# Patient Record
Sex: Male | Born: 1960 | Race: White | Hispanic: No | Marital: Married | Smoking: Current every day smoker
Health system: Southern US, Community
[De-identification: ages and names within clinical notes are randomized; demographics above are authoritative.]

## PROBLEM LIST (undated history)

## (undated) DIAGNOSIS — M545 Low back pain, unspecified: Secondary | ICD-10-CM

## (undated) DIAGNOSIS — N200 Calculus of kidney: Secondary | ICD-10-CM

## (undated) DIAGNOSIS — Z8719 Personal history of other diseases of the digestive system: Secondary | ICD-10-CM

## (undated) DIAGNOSIS — F411 Generalized anxiety disorder: Secondary | ICD-10-CM

## (undated) HISTORY — DX: Low back pain: M54.5

## (undated) HISTORY — DX: Calculus of kidney: N20.0

## (undated) HISTORY — DX: Low back pain, unspecified: M54.50

## (undated) HISTORY — DX: Personal history of other diseases of the digestive system: Z87.19

## (undated) HISTORY — DX: Generalized anxiety disorder: F41.1

---

## 2005-01-14 ENCOUNTER — Ambulatory Visit: Payer: Self-pay | Admitting: Cardiology

## 2005-01-14 ENCOUNTER — Ambulatory Visit: Payer: Self-pay | Admitting: Family Medicine

## 2005-01-19 ENCOUNTER — Ambulatory Visit: Payer: Self-pay | Admitting: Family Medicine

## 2005-02-01 ENCOUNTER — Ambulatory Visit: Payer: Self-pay | Admitting: Internal Medicine

## 2005-05-04 ENCOUNTER — Ambulatory Visit: Payer: Self-pay | Admitting: Internal Medicine

## 2006-03-27 ENCOUNTER — Ambulatory Visit: Payer: Self-pay | Admitting: Internal Medicine

## 2006-05-29 ENCOUNTER — Ambulatory Visit: Payer: Self-pay | Admitting: Internal Medicine

## 2006-05-29 LAB — CONVERTED CEMR LAB
ALT: 57 units/L — ABNORMAL HIGH (ref 0–40)
Albumin: 4 g/dL (ref 3.5–5.2)
Alkaline Phosphatase: 48 units/L (ref 39–117)
Basophils Absolute: 0 10*3/uL (ref 0.0–0.1)
Chol/HDL Ratio, serum: 3.4
Cholesterol: 227 mg/dL (ref 0–200)
Creatinine, Ser: 1.2 mg/dL (ref 0.4–1.5)
Eosinophil percent: 3 % (ref 0.0–5.0)
Glomerular Filtration Rate, Af Am: 85 mL/min/{1.73_m2}
Glucose, Bld: 99 mg/dL (ref 70–99)
HCT: 45.8 % (ref 39.0–52.0)
Hemoglobin: 15.7 g/dL (ref 13.0–17.0)
MCHC: 34.2 g/dL (ref 30.0–36.0)
MCV: 93.1 fL (ref 78.0–100.0)
Monocytes Absolute: 0.8 10*3/uL — ABNORMAL HIGH (ref 0.2–0.7)
Monocytes Relative: 15.5 % — ABNORMAL HIGH (ref 3.0–11.0)
Neutro Abs: 2.9 10*3/uL (ref 1.4–7.7)
Neutrophils Relative %: 54.5 % (ref 43.0–77.0)
Nitrite: NEGATIVE
PSA: 1.63 ng/mL (ref 0.10–4.00)
RBC: 4.92 M/uL (ref 4.22–5.81)
RDW: 12 % (ref 11.5–14.6)
Total Bilirubin: 1 mg/dL (ref 0.3–1.2)
Total Protein, Urine: 30 mg/dL — AB
Total Protein: 7.6 g/dL (ref 6.0–8.3)
Triglyceride fasting, serum: 186 mg/dL — ABNORMAL HIGH (ref 0–149)
WBC: 5.3 10*3/uL (ref 4.5–10.5)

## 2006-05-31 ENCOUNTER — Ambulatory Visit: Payer: Self-pay | Admitting: Internal Medicine

## 2006-10-10 ENCOUNTER — Ambulatory Visit: Payer: Self-pay | Admitting: Internal Medicine

## 2006-12-30 IMAGING — CT CT ABDOMEN W/ CM
1 of 4 series · 14 of 32 positions shown, 19 images · IV contrast (omnipaque)
Comparison: none

CLINICAL DATA: Abdominal pain, particularly right lower quadrant. 
ABDOMEN CT WITH CONTRAST:
TECHNIQUE: Multidetector CT imaging of the abdomen was performed following the standard protocol during bolus administration of intravenous contrast.
Contrast:  125 cc Omnipaque 300.
The lung bases are clear.  The liver enhances normally with no focal abnormality and no ductal dilatation is seen.  No calcified gallstones are noted.  The pancreas is normal in size.   The adrenal glands and spleen also appear normal.  The kidneys enhance normally. There is a non-obstructing left lower pole renal calculus of 4 x 8 mm. There is mucosal thickening and pericolonic stranding is noted over the ascending colon above the ileocecal valve. This most likely represents diverticulitis of the ascending colon but neoplasm cannot be excluded and follow-up assessment of the colon is recommended.
TECHNIQUE: Multidetector CT imaging of the pelvis was performed following the standard protocol during bolus administration of intravenous contrast.
Scans were continued through the pelvis after oral and IV contrast media were given.  The appendix and terminal ileum appear normal.  The urinary bladder is unremarkable.  No pelvic mass or fluid is seen.

[Series 2: abd_pel 5.0 b30f st · axial · 0.76mm/px · z∈[-438,-68]mm · 14 of 84 slices shown, 19 images]
[im 5/84  soft-tissue]
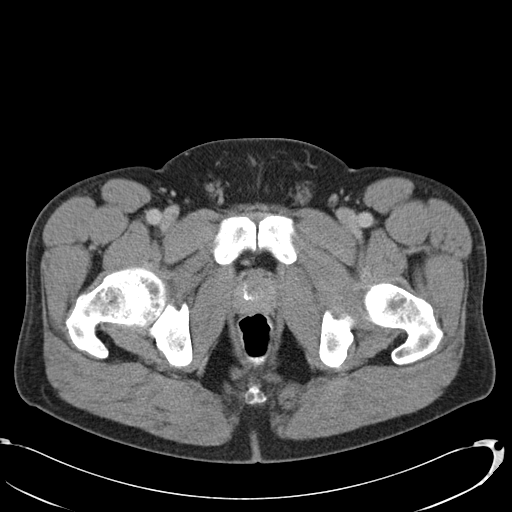
[im 5/84  bone]
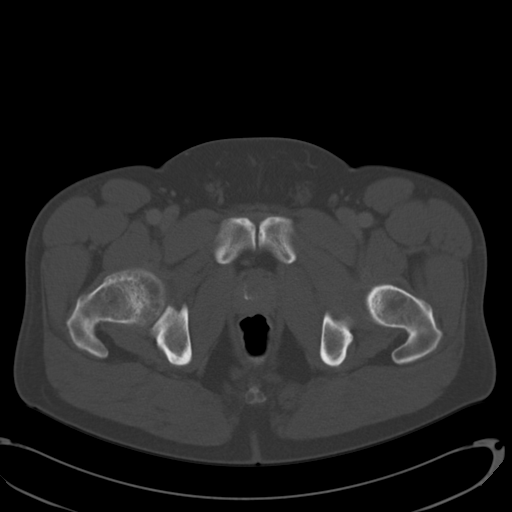
[im 10/84  soft-tissue]
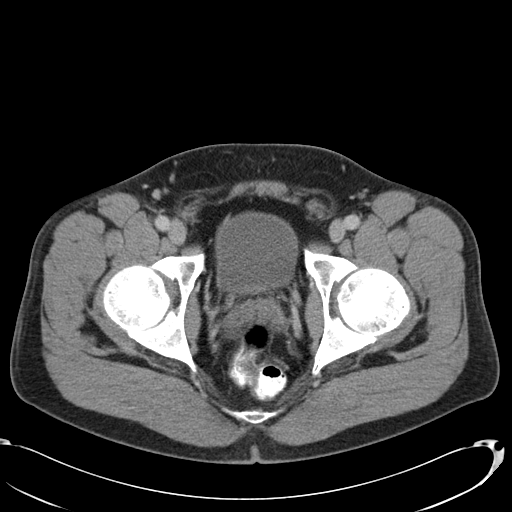
[im 19/84  soft-tissue]
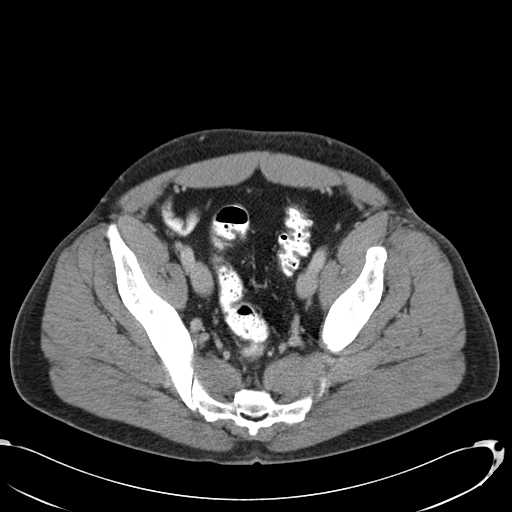
[im 24/84  soft-tissue]
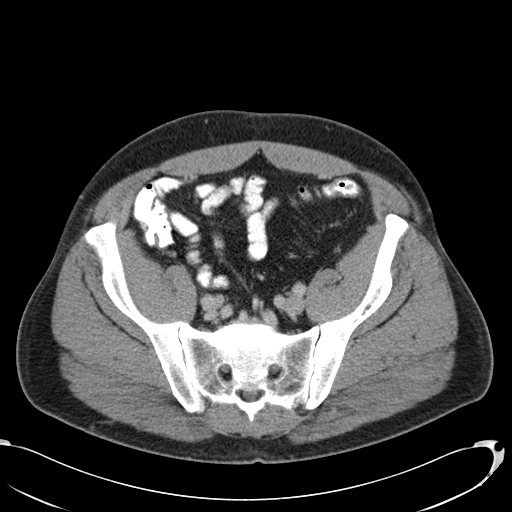
[im 28/84  soft-tissue]
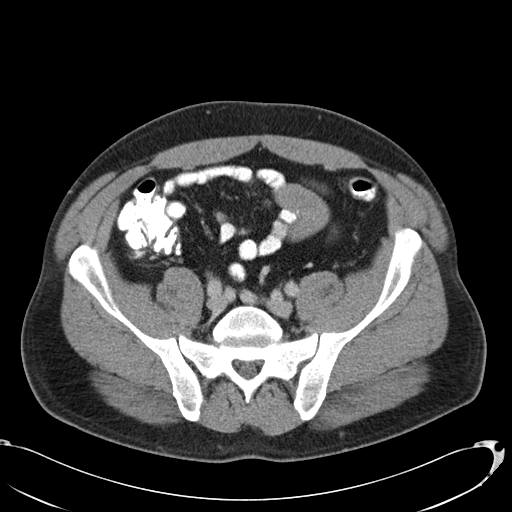
[im 37/84  soft-tissue]
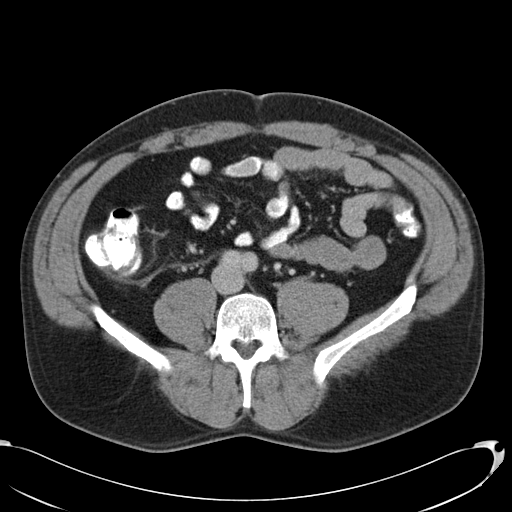
[im 42/84  soft-tissue]
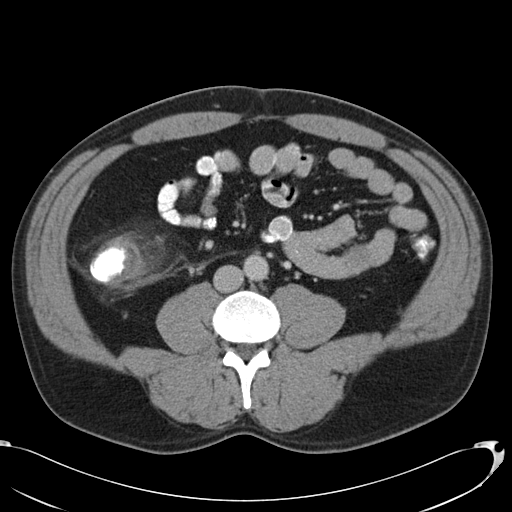
[im 47/84  soft-tissue]
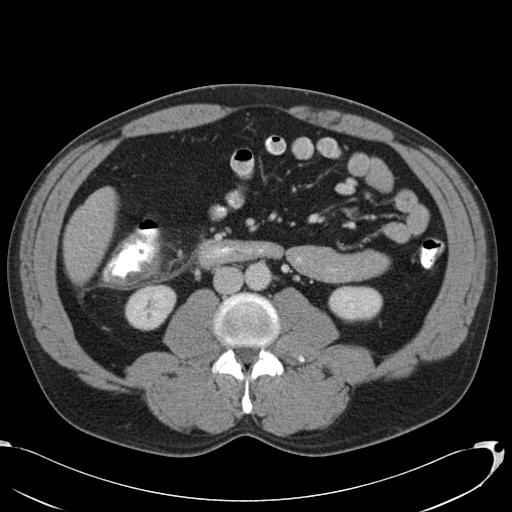
[im 56/84  soft-tissue]
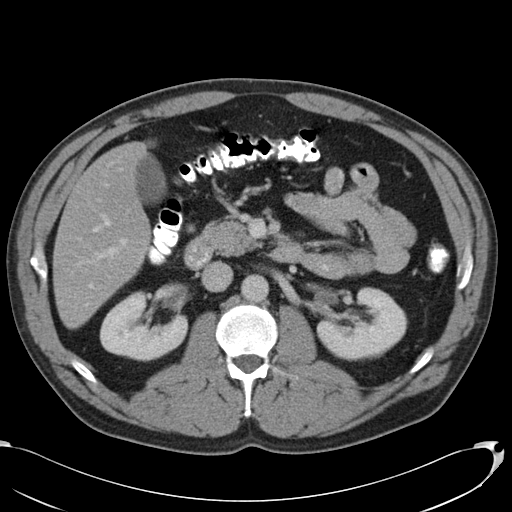
[im 56/84  bone]
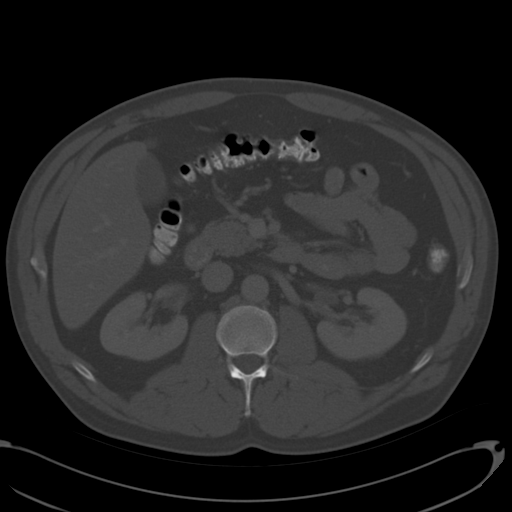
[im 60/84  soft-tissue]
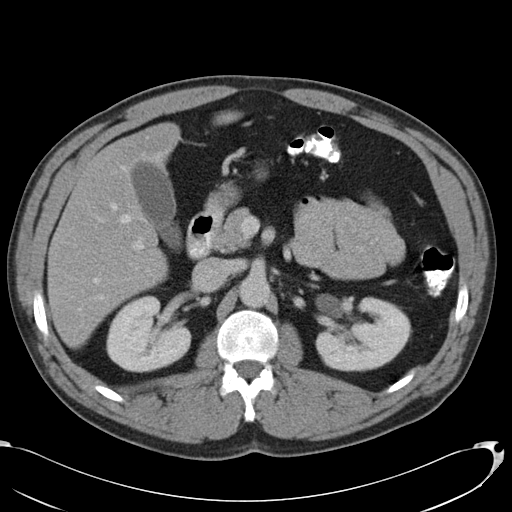
[im 65/84  soft-tissue]
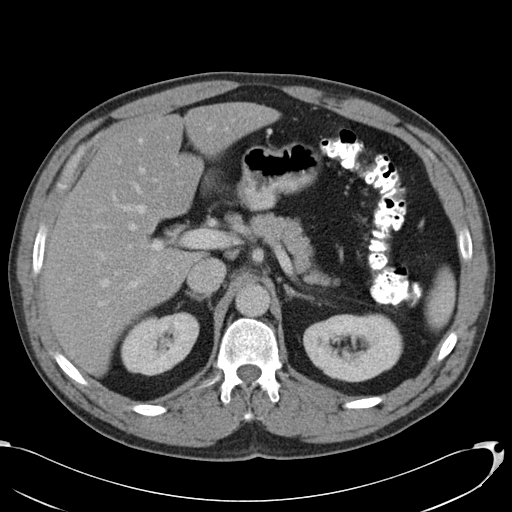
[im 65/84  lung]
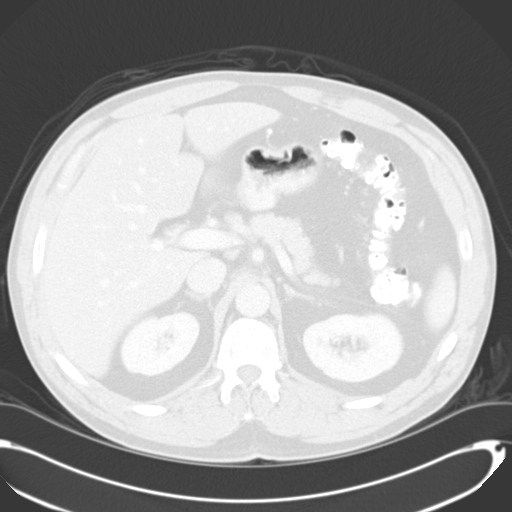
[im 70/84  lung]
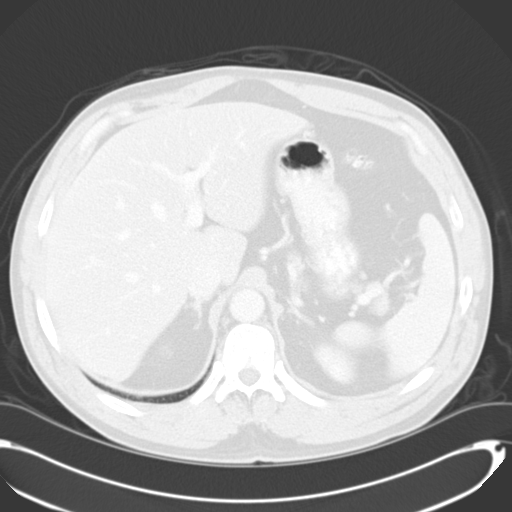
[im 74/84  soft-tissue]
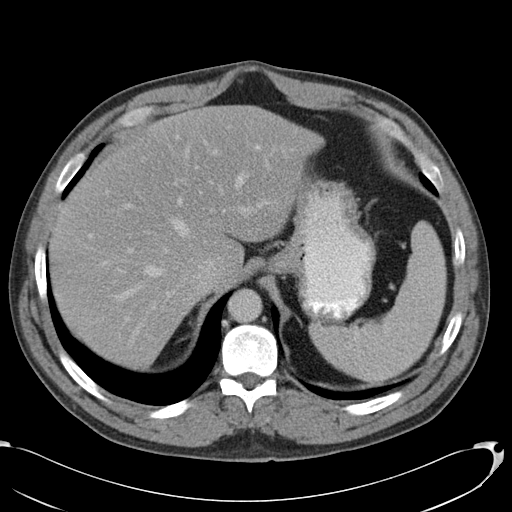
[im 74/84  lung]
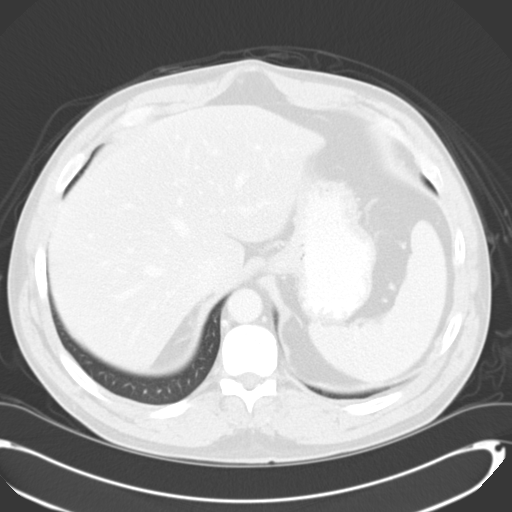
[im 79/84  soft-tissue]
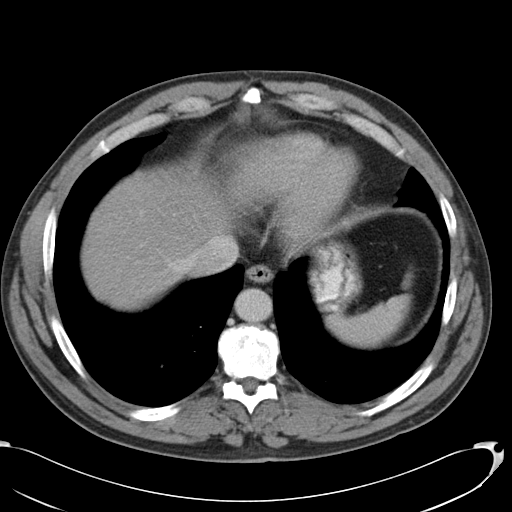
[im 79/84  lung]
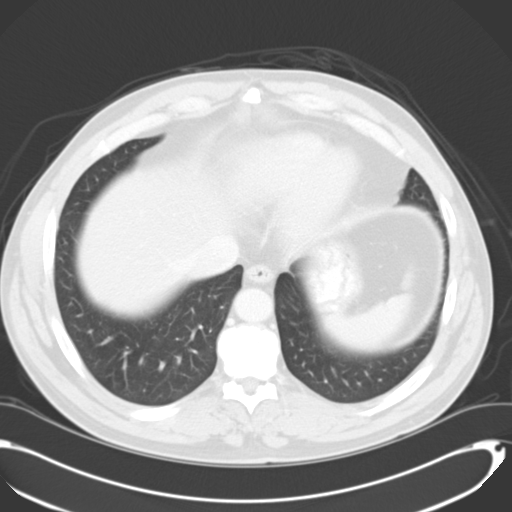

[14 of 32 positions shown; findings below may reference images not displayed]

IMPRESSION: 1.  Mucosal thickening and pericolonic strandiness of ascending colon.  Probable diverticulitis but suggest follow-up to exclude possible neoplasm. 
2.  Non-obstructing left renal calculus of 4 x 8 mm.  
PELVIS CT WITH CONTRAST:
IMPRESSION: Negative CT of the pelvis.  The appendix and terminal ileum appear normal.

## 2007-04-26 ENCOUNTER — Telehealth: Payer: Self-pay | Admitting: Internal Medicine

## 2007-05-05 ENCOUNTER — Encounter: Payer: Self-pay | Admitting: *Deleted

## 2007-05-05 DIAGNOSIS — F419 Anxiety disorder, unspecified: Secondary | ICD-10-CM | POA: Insufficient documentation

## 2007-05-05 DIAGNOSIS — N200 Calculus of kidney: Secondary | ICD-10-CM

## 2007-05-05 DIAGNOSIS — F410 Panic disorder [episodic paroxysmal anxiety] without agoraphobia: Secondary | ICD-10-CM

## 2007-05-05 DIAGNOSIS — M545 Low back pain: Secondary | ICD-10-CM | POA: Insufficient documentation

## 2007-05-05 DIAGNOSIS — Z8719 Personal history of other diseases of the digestive system: Secondary | ICD-10-CM

## 2007-05-05 DIAGNOSIS — F411 Generalized anxiety disorder: Secondary | ICD-10-CM | POA: Insufficient documentation

## 2007-11-29 ENCOUNTER — Telehealth: Payer: Self-pay | Admitting: Internal Medicine

## 2008-04-09 ENCOUNTER — Telehealth: Payer: Self-pay | Admitting: Internal Medicine

## 2008-07-14 ENCOUNTER — Telehealth: Payer: Self-pay | Admitting: Internal Medicine

## 2008-08-04 ENCOUNTER — Ambulatory Visit: Payer: Self-pay | Admitting: Internal Medicine

## 2008-08-07 ENCOUNTER — Encounter: Payer: Self-pay | Admitting: Internal Medicine

## 2008-08-07 ENCOUNTER — Ambulatory Visit: Payer: Self-pay | Admitting: Internal Medicine

## 2008-08-07 DIAGNOSIS — J069 Acute upper respiratory infection, unspecified: Secondary | ICD-10-CM | POA: Insufficient documentation

## 2008-08-14 ENCOUNTER — Ambulatory Visit: Payer: Self-pay | Admitting: Internal Medicine

## 2008-08-14 DIAGNOSIS — F1010 Alcohol abuse, uncomplicated: Secondary | ICD-10-CM | POA: Insufficient documentation

## 2008-08-21 ENCOUNTER — Telehealth: Payer: Self-pay | Admitting: Internal Medicine

## 2009-01-16 ENCOUNTER — Telehealth: Payer: Self-pay | Admitting: Internal Medicine

## 2009-06-04 ENCOUNTER — Telehealth: Payer: Self-pay | Admitting: Internal Medicine

## 2009-12-14 ENCOUNTER — Ambulatory Visit: Payer: Self-pay | Admitting: Internal Medicine

## 2010-06-16 NOTE — Assessment & Plan Note (Signed)
Summary: FU ON RX/ DIDN'T WANT A CPX/NWS   Vital Signs:  Patient profile:   50 year old male Height:      70 inches Weight:      215 pounds BMI:     30.96 O2 Sat:      93 % on Room air Temp:     98.5 degrees F oral Pulse rate:   85 / minute BP sitting:   142 / 90  (left arm) Cuff size:   large  Vitals Entered By: Bill Salinas CMA (December 14, 2009 1:55 PM)  O2 Flow:  Room air CC: office visit for refills on alprazolam/ ab   Primary Care Provider:  Jacques Navy MD  CC:  office visit for refills on alprazolam/ ab.  History of Present Illness: Patient presents for refill on alprazolam. His last physical exam was Jan '08. Labs from that visit reviewed: he had LFDL 218.8, HDL 66.1, minimal elevation in transaminases and otherwise normal labs.  He would like to return for CPX and labs.   Did discuss issue of EtOH use: this was a problem in the past and he admits that it is a problem now.   Current Medications (verified): 1)  Alprazolam 1 Mg Tabs (Alprazolam) .Marland Kitchen.. 1 Three Times A Day 2)  Campral Dose Pak 333 Mg Tbec (Acamprosate Calcium) .... 2 Tabs Three Times A Day For Abstinence  Allergies (verified): No Known Drug Allergies  Past History:  Past Medical History: Last updated: 05/05/2007 Hx of NEPHROLITHIASIS (ICD-592.0) Hx of LOW BACK PAIN SYNDROME (ICD-724.2) DIVERTICULITIS, HX OF (ICD-V12.79) ANXIETY (ICD-300.00) PANIC ATTACK (ICD-300.01)    Review of Systems       The patient complains of weight gain.  The patient denies anorexia, fever, weight loss, chest pain, dyspnea on exertion, abdominal pain, severe indigestion/heartburn, difficulty walking, and enlarged lymph nodes.    Physical Exam  General:  Overweight white male in no distress Head:  normocephalic and no abnormalities observed.   Eyes:  C&S clear Neck:  supple and full ROM.   Lungs:  normal respiratory effort and normal breath sounds.   Heart:  normal rate and regular rhythm.   Neurologic:   alert & oriented X3, cranial nerves II-XII intact, and strength normal in all extremities.   Skin:  turgor normal and color normal.   Psych:  Oriented X3, normally interactive, good eye contact, and not anxious appearing.     Impression & Recommendations:  Problem # 1:  ANXIETY (ICD-300.00) Patient has done well on alprazolam.  Plan - renewal of Rx provided  His updated medication list for this problem includes:    Alprazolam 1 Mg Tabs (Alprazolam) .Marland Kitchen... 1 three times a day  Problem # 2:  ALCOHOL ABUSE (ICD-305.00) Did discuss his use: drinks 8+ oz per day. No issues with work or personal life but he admits that this may be a bit too much alcohol.  Problem # 3:  Preventive Health Care (ICD-V70.0) He will return in 2-4 weeks for CPX and labs.  Complete Medication List: 1)  Alprazolam 1 Mg Tabs (Alprazolam) .Marland Kitchen.. 1 three times a day 2)  Campral Dose Pak 333 Mg Tbec (Acamprosate calcium) .... 2 tabs three times a day for abstinence Prescriptions: ALPRAZOLAM 1 MG TABS (ALPRAZOLAM) 1 three times a day  #90 x 5   Entered and Authorized by:   Jacques Navy MD   Signed by:   Jacques Navy MD on 12/14/2009   Method used:  Print then Give to Patient   RxID:   [id number]

## 2010-06-16 NOTE — Progress Notes (Signed)
  Phone Note Refill Request Message from:  Fax from Pharmacy on June 04, 2009 9:12 AM  Refills Requested: Medication #1:  ALPRAZOLAM 1 MG TABS 1 three times a day   Last Refilled: 01/20/2009 please Advise refill. Last office visit was 08/14/2008  Initial call taken by: Ami Bullins CMA,  June 04, 2009 9:12 AM  Follow-up for Phone Call        ok for refill x 5. Will need CPX in the next 6 months Follow-up by: Jacques Navy MD,  June 04, 2009 12:38 PM  Additional Follow-up for Phone Call Additional follow up Details #1::        Patient receives #90 at a time, should we still do refill x5? Additional Follow-up by: Lucious Groves,  June 04, 2009 1:26 PM    Additional Follow-up for Phone Call Additional follow up Details #2::    I really meant it....Marland Kitchenyes Follow-up by: Jacques Navy MD,  June 04, 2009 5:28 PM  Prescriptions: ALPRAZOLAM 1 MG TABS (ALPRAZOLAM) 1 three times a day  #90 x 5   Entered by:   Lamar Sprinkles, CMA   Authorized by:   Jacques Navy MD   Signed by:   Lamar Sprinkles, CMA on 06/05/2009   Method used:   Telephoned to ...       [facility] [address] (retail)       [address]       [place], [place]  [id number]       Ph: [phone]       Fax: [phone]   RxID:   [id number]

## 2010-06-28 ENCOUNTER — Telehealth: Payer: Self-pay | Admitting: Internal Medicine

## 2010-07-07 NOTE — Progress Notes (Signed)
Summary: RF  Phone Note Refill Request Message from:  Pharmacy  Refills Requested: Medication #1:  ALPRAZOLAM 1 MG TABS 1 three times a day Initial call taken by: Lamar Sprinkles, CMA,  June 28, 2010 10:37 AM  Follow-up for Phone Call        OK x 5 Follow-up by: Jacques Navy MD,  June 28, 2010 12:48 PM    Prescriptions: ALPRAZOLAM 1 MG TABS (ALPRAZOLAM) 1 three times a day  #90 x 3   Entered by:   Lamar Sprinkles, CMA   Authorized by:   Jacques Navy MD   Signed by:   Lamar Sprinkles, CMA on 06/28/2010   Method used:   Telephoned to ...       [facility] (retail)       [address]       [place], [place]  [id number]       Ph: [phone]       Fax: [phone]   RxID:   [id number]

## 2010-09-29 ENCOUNTER — Encounter: Payer: Self-pay | Admitting: Internal Medicine

## 2010-09-29 ENCOUNTER — Ambulatory Visit (INDEPENDENT_AMBULATORY_CARE_PROVIDER_SITE_OTHER): Payer: PRIVATE HEALTH INSURANCE | Admitting: Internal Medicine

## 2010-09-29 VITALS — BP 138/94 | HR 80 | Temp 98.8°F | Wt 205.0 lb

## 2010-09-29 DIAGNOSIS — L0390 Cellulitis, unspecified: Secondary | ICD-10-CM

## 2010-09-29 MED ORDER — ALPRAZOLAM 1 MG PO TABS: 1.0000 mg | ORAL_TABLET | Freq: Three times a day (TID) | ORAL | Status: DC | PRN

## 2010-09-29 MED ORDER — DOXYCYCLINE HYCLATE 100 MG PO TABS: 100.0000 mg | ORAL_TABLET | Freq: Two times a day (BID) | ORAL | Status: AC

## 2010-09-29 NOTE — Progress Notes (Signed)
  Subjective:    Patient ID: Rodney [name], male    DOB: 1961-02-03, 50 y.o.   MRN: [id number]  HPI Rodney [name] reports that he found an embedded small tick in the right groin several days ago. It had not food (wasn't blood filled at removal). He has subsequently developed erythema and swelling in the area. He never had a target lesion. He has not had severe headache or fever.   PMH, FamHx and SocHx reviewed for any changes and relevance.    Review of Systems Review of Systems  Constitutional:  Negative for fever, chills, activity change and unexpected weight change.  HENT:  Negative for hearing loss, ear pain, congestion, neck stiffness and postnasal drip.   Eyes: Negative for pain, discharge and visual disturbance.  Respiratory: Negative for chest tightness and wheezing.   Cardiovascular: Negative for chest pain and palpitations.       [No decreased exercise tolerance Gastrointestinal: [No change in bowel habit. No bloating or gas. No reflux or indigestion Genitourinary: Negative for urgency, frequency, flank pain and difficulty urinating.  Musculoskeletal: Negative for myalgias, back pain, arthralgias and gait problem.  Neurological: Negative for dizziness, tremors, weakness and headaches.  Hematological: Negative for adenopathy.  Psychiatric/Behavioral: Negative for behavioral problems and dysphoric mood.       Objective:   Physical Exam Virtals noted Chest - clear Cor - RRR Derm- erythema and swelling in the right groin with a visible punctate point.       Assessment & Plan:  1. Cellulits - secondary to tick bite. Does not appear by history or exam to be Lyme's or RMSF  Plan - doxycyline 100mg  bid x 7 days.             Warm compresses.

## 2010-10-01 NOTE — Assessment & Plan Note (Signed)
[facility]                           PRIMARY CARE OFFICE NOTE   NAME:Rodney [name], Rodney [name]                       MRN:          [id number]  DATE:05/31/2006                            DOB:          May 31, 1960    Rodney [name] is a 50 year old Caucasian gentleman who presents for general  physical exam.  The patient initially reported that he had no problems.  On questioning him after reviewing labs, he does admit to having an  episode of severe left side and flank pain May 28, 2006 with some  radiation towards his groin.  This was enough for him to be very  uncomfortable for several hours and required the use of a heating pad.  The patient otherwise reports he is feeling well and doing well.   PAST MEDICAL HISTORY:  Family history and social history is well  documented in my note of May 04, 2005 with no significant change.  The patient was seen March 27, 2006 in regards to smoking cessation.   CURRENT MEDICATIONS:  Xanax 1 mg t.i.d.   REVIEW OF SYSTEMS:  Negative in constitutional, cardiovascular,  respiratory, GI, and musculoskeletal.   PROBLEM:  Genitourinary as above.   EXAMINATION:  Temperature was 98.7, blood pressure 142/96, pulse 81,  weight 228.  GENERAL APPEARANCE:  A heavyset Caucasian male in no acute distress.  HEENT:  Normocephalic/atraumatic, EACs and TMs were unremarkable,  oropharynx with native dentition in good repair, no buccal or palatal  lesions were noted, posterior pharynx was clear, conjunctivae and  sclerae was clear, PERRLA/EOMI, funduscopic exam was unremarkable.  NECK:  Supple without thyromegaly.  NODES:  No adenopathy was noted in the cervical supraclavicular regions.  CHEST:  No CVA tenderness.  LUNGS:  Clear to auscultation and percussion.  CARDIOVASCULAR:  There were 2+ radial pulse, no JVD or carotid bruits.  He had a quiet precordium with regular rate and rhythm without murmurs,  rubs, or gallops.  ABDOMEN:  Protuberant, soft, no guarding or rebound, no  organosplenomegaly was noted.  GENITALIA:  Normal male phallus, bilaterally descended testicles without  masses.  RECTAL:  Normal sphincter tone was noted, prostate was smooth, round,  normal size and contour without abnormality.  EXTREMITIES:  Without clubbing, cyanosis, edema, or deformity.  NEUROLOGIC:  Nonfocal.  SKIN:  Patient has tattoos on his right forearm, left chest, and left  scapular region.   DATABASE:  Hemoglobin was 15.7 grams, white count was 5300 with a normal  differential.  Cholesterol was 227, triglycerides 186, HDL was 66, LDL  was 128.8.  Chemistries revealed a normal blood sugar of 99,  electrolytes were normal.  Kidney function normal with a creatinine of  1.2, GFR of 70, SGOT minimally elevated at 42, SGPD minimally elevated  at 57.  Thyroid function normal with a TSH of 3.95.  PSA was normal at  1.63.  Urinalysis was positive for blood, protein with 5-10 RBCs per  high powered field, rare bacteria.   ASSESSMENT/PLAN:  1. Tobacco abuse.  I have encouraged the patient to continue to pursue      smoking  cessation.  He has not yet called the 1-800-QUITNOW number      and I have encouraged him to do the same.  2. Elevated transaminases.  The patient with very minimally elevated      transaminases, and this is different from previous study where they      were normal.  The patient does admit to some heavy alcohol use      recently.   PLAN:  1. I have advised the patient to be more abstemious.  2. Hematuria.  The patient does have hematuria.  History was elicit of      significant flank pain and suspect the patient may have passed a      stone.  Plan patient to have a followup urinalysis in 4 weeks.  3. Health maintenance.  The patient otherwise had an unremarkable      examination.  His LDL at 128.8 is at goal for a low risk patient.  4. The patient is encouraged to follow weight loss program as well as       once again encouraged to follow smoking cessation program.  The      patient will return to see me on a p.r.n. basis.  He will return      for lab work in 4 weeks.     Rosalyn Gess Norins, MD  Electronically Signed    MEN/MedQ  DD: 06/01/2006  DT: 06/01/2006  Job #: [id number]   cc:   Pollie Meyer

## 2011-01-31 ENCOUNTER — Other Ambulatory Visit: Payer: Self-pay | Admitting: Internal Medicine

## 2011-01-31 NOTE — Telephone Encounter (Signed)
Please Advise in Dr Debby Bud absence

## 2011-02-07 ENCOUNTER — Other Ambulatory Visit: Payer: Self-pay | Admitting: Internal Medicine

## 2011-02-07 NOTE — Telephone Encounter (Signed)
Please Advise refill on xanax 

## 2011-02-07 NOTE — Telephone Encounter (Signed)
Ok for refill x 5 

## 2011-02-08 MED ORDER — ALPRAZOLAM 1 MG PO TABS: 1.0000 mg | ORAL_TABLET | Freq: Every evening | ORAL | Status: DC | PRN

## 2011-05-27 ENCOUNTER — Telehealth: Payer: Self-pay | Admitting: Internal Medicine

## 2011-05-27 NOTE — Telephone Encounter (Signed)
Refill request refill for alprazolam 1mg . With 2 refills. Last refilled on 9.24.12. OK to refill?

## 2011-05-27 NOTE — Telephone Encounter (Signed)
Pt called requesting rx of xanax refilled. Thanks!

## 2011-05-27 NOTE — Telephone Encounter (Signed)
OK to fill this prescription with additional refills x2 Thank you!  

## 2011-05-30 ENCOUNTER — Telehealth: Payer: Self-pay | Admitting: *Deleted

## 2011-05-30 NOTE — Telephone Encounter (Signed)
Ok for refill  x3 

## 2011-05-30 NOTE — Telephone Encounter (Signed)
Received fax from [facility] pt is requesting refill on alprazolam 1 mg. Is this ok?Marland Kitchen..05/30/11@2 :18pm/LMB

## 2011-05-31 ENCOUNTER — Other Ambulatory Visit: Payer: Self-pay | Admitting: Internal Medicine

## 2011-05-31 MED ORDER — ALPRAZOLAM 1 MG PO TABS: 1.0000 mg | ORAL_TABLET | Freq: Every evening | ORAL | Status: DC | PRN

## 2011-05-31 NOTE — Telephone Encounter (Signed)
Rx call into [facility] spoke with Kathlene November ok # 90 with 3 additional refills.Marland KitchenMarland Kitchen1/15/13@12 :01pm/LMB

## 2011-05-31 NOTE — Telephone Encounter (Signed)
Rx call in this am to pharmacy.Marland KitchenMarland Kitchen1/15/13@1 :27pm/LMB

## 2011-11-02 ENCOUNTER — Other Ambulatory Visit: Payer: Self-pay | Admitting: Internal Medicine

## 2011-11-02 NOTE — Telephone Encounter (Signed)
Patient request refill on alprazalam.  Last office visit. 09/2010

## 2011-11-03 ENCOUNTER — Other Ambulatory Visit: Payer: Self-pay | Admitting: *Deleted

## 2011-11-03 MED ORDER — ALPRAZOLAM 1 MG PO TABS: 1.0000 mg | ORAL_TABLET | Freq: Three times a day (TID) | ORAL | Status: DC | PRN

## 2011-11-03 NOTE — Addendum Note (Signed)
Addended by: Elnora Morrison on: 11/03/2011 04:29 PM   Modules accepted: Orders

## 2012-01-05 ENCOUNTER — Other Ambulatory Visit (INDEPENDENT_AMBULATORY_CARE_PROVIDER_SITE_OTHER): Payer: PRIVATE HEALTH INSURANCE

## 2012-01-05 ENCOUNTER — Encounter: Payer: Self-pay | Admitting: Internal Medicine

## 2012-01-05 ENCOUNTER — Ambulatory Visit (INDEPENDENT_AMBULATORY_CARE_PROVIDER_SITE_OTHER): Payer: PRIVATE HEALTH INSURANCE | Admitting: Internal Medicine

## 2012-01-05 ENCOUNTER — Ambulatory Visit: Payer: PRIVATE HEALTH INSURANCE | Admitting: Internal Medicine

## 2012-01-05 VITALS — BP 128/88 | HR 77 | Temp 98.7°F | Resp 16 | Wt 207.2 lb

## 2012-01-05 DIAGNOSIS — L03119 Cellulitis of unspecified part of limb: Secondary | ICD-10-CM

## 2012-01-05 DIAGNOSIS — S80869A Insect bite (nonvenomous), unspecified lower leg, initial encounter: Secondary | ICD-10-CM | POA: Insufficient documentation

## 2012-01-05 DIAGNOSIS — M79669 Pain in unspecified lower leg: Secondary | ICD-10-CM

## 2012-01-05 DIAGNOSIS — W57XXXA Bitten or stung by nonvenomous insect and other nonvenomous arthropods, initial encounter: Secondary | ICD-10-CM

## 2012-01-05 DIAGNOSIS — L02419 Cutaneous abscess of limb, unspecified: Secondary | ICD-10-CM

## 2012-01-05 DIAGNOSIS — T148XXA Other injury of unspecified body region, initial encounter: Secondary | ICD-10-CM

## 2012-01-05 DIAGNOSIS — M79609 Pain in unspecified limb: Secondary | ICD-10-CM

## 2012-01-05 DIAGNOSIS — S90569A Insect bite (nonvenomous), unspecified ankle, initial encounter: Secondary | ICD-10-CM

## 2012-01-05 LAB — COMPREHENSIVE METABOLIC PANEL
AST: 121 U/L — ABNORMAL HIGH (ref 0–37)
Albumin: 4.6 g/dL (ref 3.5–5.2)
Alkaline Phosphatase: 47 U/L (ref 39–117)
Glucose, Bld: 83 mg/dL (ref 70–99)
Potassium: 4 mEq/L (ref 3.5–5.1)
Sodium: 132 mEq/L — ABNORMAL LOW (ref 135–145)
Total Protein: 8.9 g/dL — ABNORMAL HIGH (ref 6.0–8.3)

## 2012-01-05 LAB — CBC WITH DIFFERENTIAL/PLATELET
HCT: 46 % (ref 39.0–52.0)
Hemoglobin: 15.6 g/dL (ref 13.0–17.0)
MCV: 96.7 fl (ref 78.0–100.0)
Platelets: 152 10*3/uL (ref 150.0–400.0)
WBC: 4.8 10*3/uL (ref 4.5–10.5)

## 2012-01-05 LAB — SEDIMENTATION RATE: Sed Rate: 14 mm/hr (ref 0–22)

## 2012-01-05 MED ORDER — DOXYCYCLINE HYCLATE 100 MG PO TBEC: 100.0000 mg | DELAYED_RELEASE_TABLET | Freq: Two times a day (BID) | ORAL | Status: AC

## 2012-01-05 NOTE — Assessment & Plan Note (Signed)
Start doxycycline 

## 2012-01-05 NOTE — Assessment & Plan Note (Signed)
Start doxycycline and check labs for inflammation, lyme disease, infection

## 2012-01-05 NOTE — Patient Instructions (Signed)

## 2012-01-05 NOTE — Progress Notes (Signed)
Subjective:    Patient ID: Rodney [name], male    DOB: 10-Sep-1960, 51 y.o.   MRN: [id number]  Wound Check Treated in ED: tick bite right calf 5 weeks ago, scab/pain/swelling persists. There has been no drainage from the wound. There is new redness present. There is new swelling present. The pain has new pain.      Review of Systems  Constitutional: Negative for fever, chills, diaphoresis, activity change, appetite change, fatigue and unexpected weight change.  HENT: Negative.   Eyes: Negative.   Respiratory: Negative for cough, chest tightness, shortness of breath, wheezing and stridor.   Cardiovascular: Positive for leg swelling (pain and swelling in right calf). Negative for chest pain and palpitations.  Gastrointestinal: Negative.   Genitourinary: Negative.   Musculoskeletal: Negative for myalgias, back pain, joint swelling, arthralgias and gait problem.  Skin: Positive for wound (scab on right calf). Negative for color change, pallor and rash.  Neurological: Negative.   Hematological: Negative for adenopathy. Does not bruise/bleed easily.       Objective:   Physical Exam  Vitals reviewed. Constitutional: He is oriented to person, place, and time. He appears well-developed and well-nourished. No distress.  HENT:  Head: Normocephalic and atraumatic.  Mouth/Throat: Oropharynx is clear and moist. No oropharyngeal exudate.  Eyes: Conjunctivae are normal. Right eye exhibits no discharge. Left eye exhibits no discharge. No scleral icterus.  Neck: Normal range of motion. Neck supple. No JVD present. No tracheal deviation present. No thyromegaly present.  Cardiovascular: Normal rate, regular rhythm, normal heart sounds and intact distal pulses.  Exam reveals no gallop and no friction rub.   No murmur heard. Pulses:      Carotid pulses are 1+ on the right side, and 1+ on the left side.      Radial pulses are 1+ on the right side, and 1+ on the left side.       Femoral pulses are 1+  on the right side, and 1+ on the left side.      Popliteal pulses are 1+ on the right side, and 1+ on the left side.       Dorsalis pedis pulses are 1+ on the right side, and 1+ on the left side.       Posterior tibial pulses are 1+ on the right side, and 1+ on the left side.  Pulmonary/Chest: Effort normal and breath sounds normal. No stridor. No respiratory distress. He has no wheezes. He has no rales. He exhibits no tenderness.  Abdominal: Soft. Bowel sounds are normal. He exhibits no distension and no mass. There is no tenderness. There is no rebound and no guarding.  Musculoskeletal: Normal range of motion. He exhibits no edema and no tenderness.       Right lower leg: He exhibits swelling (right calf is very mildly swollen but there is no warmth or erythema). He exhibits no tenderness, no bony tenderness, no edema, no deformity and no laceration.  Lymphadenopathy:    He has no cervical adenopathy.    He has no axillary adenopathy.       Right: No inguinal adenopathy present.       Left: No inguinal adenopathy present.  Neurological: He is oriented to person, place, and time.  Skin: Skin is warm and dry. Abrasion and lesion noted. No bruising, no burn, no ecchymosis, no laceration, no petechiae, no purpura and no rash noted. Rash is not macular, not papular, not maculopapular, not nodular, not pustular, not vesicular and not urticarial.  He is not diaphoretic. No cyanosis or erythema. No pallor.     Psychiatric: He has a normal mood and affect. His behavior is normal. Thought content normal.      Lab Results  Component Value Date   WBC 5.3 05/29/2006   HGB 15.7 05/29/2006   HCT 45.8 05/29/2006   PLT 301 05/29/2006   GLUCOSE 99 05/29/2006   CHOL 227* 05/29/2006   HDL 66.1 05/29/2006   LDLDIRECT 128.8 05/29/2006   ALT 57* 05/29/2006   AST 42* 05/29/2006   NA 139 05/29/2006   K 3.8 05/29/2006   CL 104 05/29/2006   CREATININE 1.2 05/29/2006   BUN 15 05/29/2006   CO2 29 05/29/2006   TSH 3.95  05/29/2006   PSA 1.63 05/29/2006      Assessment & Plan:

## 2012-01-05 NOTE — Assessment & Plan Note (Signed)
I will check a d-dimer to see if there is concern for DVT, otherwise I would attribute this to the inflammation and infection

## 2012-03-30 ENCOUNTER — Other Ambulatory Visit: Payer: Self-pay | Admitting: Internal Medicine

## 2012-03-30 NOTE — Telephone Encounter (Signed)
Medication refill request.

## 2012-04-06 ENCOUNTER — Telehealth: Payer: Self-pay | Admitting: Internal Medicine

## 2012-04-06 NOTE — Telephone Encounter (Signed)
Pt needs a refill on [facility]anax sent to [facility] [address].  His last ov was around May of 2012.  He made an appt for Jan 27 for a physical.

## 2012-04-10 ENCOUNTER — Other Ambulatory Visit: Payer: Self-pay | Admitting: Internal Medicine

## 2012-04-17 ENCOUNTER — Other Ambulatory Visit (INDEPENDENT_AMBULATORY_CARE_PROVIDER_SITE_OTHER): Payer: PRIVATE HEALTH INSURANCE

## 2012-04-17 ENCOUNTER — Encounter: Payer: Self-pay | Admitting: Internal Medicine

## 2012-04-17 ENCOUNTER — Ambulatory Visit (INDEPENDENT_AMBULATORY_CARE_PROVIDER_SITE_OTHER)
Admission: RE | Admit: 2012-04-17 | Discharge: 2012-04-17 | Disposition: A | Discharge status: Home / Self Care | Payer: PRIVATE HEALTH INSURANCE | Source: Ambulatory Visit | Attending: Internal Medicine | Admitting: Internal Medicine

## 2012-04-17 ENCOUNTER — Ambulatory Visit (INDEPENDENT_AMBULATORY_CARE_PROVIDER_SITE_OTHER): Payer: PRIVATE HEALTH INSURANCE | Admitting: Internal Medicine

## 2012-04-17 VITALS — BP 138/100 | HR 78 | Temp 97.7°F | Resp 10 | Wt 213.0 lb

## 2012-04-17 DIAGNOSIS — F411 Generalized anxiety disorder: Secondary | ICD-10-CM

## 2012-04-17 DIAGNOSIS — Z720 Tobacco use: Secondary | ICD-10-CM | POA: Insufficient documentation

## 2012-04-17 DIAGNOSIS — F17200 Nicotine dependence, unspecified, uncomplicated: Secondary | ICD-10-CM

## 2012-04-17 DIAGNOSIS — F1010 Alcohol abuse, uncomplicated: Secondary | ICD-10-CM

## 2012-04-17 DIAGNOSIS — I10 Essential (primary) hypertension: Secondary | ICD-10-CM

## 2012-04-17 LAB — COMPREHENSIVE METABOLIC PANEL
ALT: 43 U/L (ref 0–53)
AST: 54 U/L — ABNORMAL HIGH (ref 0–37)
Albumin: 4.9 g/dL (ref 3.5–5.2)
CO2: 28 mEq/L (ref 19–32)
Calcium: 9.8 mg/dL (ref 8.4–10.5)
Chloride: 97 mEq/L (ref 96–112)
GFR: 101.77 mL/min (ref >60.00)
Potassium: 3.8 mEq/L (ref 3.5–5.1)
Total Protein: 8.7 g/dL — ABNORMAL HIGH (ref 6.0–8.3)

## 2012-04-17 MED ORDER — ALPRAZOLAM 1 MG PO TABS: 1.0000 mg | ORAL_TABLET | Freq: Every evening | ORAL | Status: DC | PRN

## 2012-04-17 MED ORDER — HYDROCHLOROTHIAZIDE 25 MG PO TABS: 25.0000 mg | ORAL_TABLET | Freq: Every day | ORAL | Status: DC

## 2012-04-17 NOTE — Patient Instructions (Addendum)
Thank you for coming to see [place]. We're glad that we could find out how you're doing and what's been going on. Here's our visit summary and recommendations:  Anxiety 1. Xanax 1 mg refilled for 6 months  Hypertension 1. BP 138/100 today, has been elevated for at least 5 years 2. ECG and CXR to look for evidence of heart involvement or enlargement 3. Labs: Lipid panel (patient is fasting today), BMET 4. Start HCTZ 25 mg daily  Alcohol Abuse 1. I'm so glad that we talked about this. Let's follow-up in January to continue our discussion. 2. I recommend AA meetings. You can't be a part-time alcoholic. 3. When you're ready to quit, select a quit date and we can support you with medication (e.g., Atavan) during withdrawal period

## 2012-04-17 NOTE — Assessment & Plan Note (Signed)
04/17/12 - Xanax 1 mg renewed for 6 months

## 2012-04-17 NOTE — Assessment & Plan Note (Addendum)
04/17/12 - In depth counseling given. Follow-up scheduled for 1 month.  (greater than 50% of 45 min visit spent on education and counseling)

## 2012-04-17 NOTE — Progress Notes (Signed)
Patient ID: Rodney [name], male   DOB: Jul 05, 1960, 51 y.o.   MRN: [id number]  HPI Rodney [name] is a 51 yo man who presents for medication follow-up. He takes Xanax 1 mg as needed for anxiety. He tries to take 1-2 tablets per day although sometimes he needs more with work stress causing anxiety. It is work-related stress and anxiety. He will often not need any Xanax on the weekends. He has been taking Xanax for approximately 20 years. He denies any side effects.   His blood pressure is high today. On review, his blood pressures have been elevated for at least 5 years.   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 Rodney [name] ([employer]), has been there for 26 years. Wife married in '07, marriage in good health. SO: torticollis, operation '10 to remove 3 discs  Patient continues to smoke 1/2 ppd. He drinks about a pint of liquor a day (16 oz, 80 proof whiskey). He admits that his drinking is a problem and he has tried to quit in the past.  Past Medical History  Diagnosis Date  . Calculus of kidney   . Lumbago   . Personal history of other diseases of digestive system   . Anxiety state, unspecified    History reviewed. No pertinent past surgical history. Family History  Problem Relation Age of Onset  . Cancer Neg Hx   . Heart disease Neg Hx   . Hyperlipidemia Neg Hx   . Hypertension Neg Hx   . Kidney disease Neg Hx   . Stroke Neg Hx    History   Social History  . Marital Status: Single    Spouse Name: N/A    Number of Children: N/A  . Years of Education: N/A   Occupational History  . Not on file.   Social History Main Topics  . Smoking status: Current Every Day Smoker -- 0.5 packs/day    Types: Cigarettes  . Smokeless tobacco: Never Used  . Alcohol Use: 60.0 oz/week    100  Shots of liquor per week     Comment: Drinks 1 pint of 80 proof whiskey a night (16 oz)  . Drug Use: No  . Sexually Active: Not Currently   Other Topics Concern  . 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 Wife married in '07, marriage in good health. SO: torticollis, operation '10 to remove 3 discs   Current Outpatient Prescriptions on File Prior to Visit  Medication Sig Dispense Refill  . ALPRAZolam (XANAX) 1 MG tablet take 1 tablet by mouth three times a day if needed  90 tablet  3   PE General: Pleasant man in NAD CV: Regular rate and rhythm, S1 and S2 present. Pulse regular. No carotid bruits. Pulm: Lungs clear to auscultation bilaterally Neuro: Alert and appropriate. No focal deficits.  A/P Rodney [name] is a 51 yo man who presents for medication follow-up.  # Anxiety - work stress-related -Will refill Xanax 1 mg for 6 months; patient has been taking this medication for 20 years, well-tolerated. Not interested in switching to a longer-acting daily medication  # Hypertension -BP 138/100 today, checked 3 times. On review, BPs have  been elevated for at least 5 years -ECG and CXR to look for evidence of heart involvement or enlargement -Labs: Lipid panel (patient is fasting today), BMET -Start HCTZ 25 mg daily  # Tobacco Abuse -Patient counseled on smoking's risk for heart disease and the importance of smoking cessation -CXR as above  # Alcohol Abuse -Patient counseled on alcohol abuse's ill effects on blood pressure and overall health. In-depth discussion on alcoholism, AA, and abstinence. Patient has tried quitting before and has attended AA meetings in the past with wife. -Labs: liver function tests -Offer support with medication (e.g., Atavan) during withdrawal period when ready to quit. Follow-up in 1 month.  Patient seen, interviewed and  examined. Agree with Assessment and Plan as above per Rodney Balcerzak, MS III  M.Norins, MD

## 2012-04-21 ENCOUNTER — Encounter: Payer: Self-pay | Admitting: Internal Medicine

## 2012-04-26 ENCOUNTER — Telehealth: Payer: Self-pay | Admitting: *Deleted

## 2012-04-26 ENCOUNTER — Encounter: Payer: Self-pay | Admitting: Internal Medicine

## 2012-04-26 NOTE — Telephone Encounter (Signed)
Pt advice request: Is it possible for you to write me a prescription for the Adivin or what ever it was for detoxing when Kennon Rounds comes for her visit on the 19th? Also you once mentioned a medicine that take the urge to drink away. I would like to try that after detoxing.

## 2012-04-26 NOTE — Telephone Encounter (Signed)
Can provide Rx for ativan at wife's visit - she will need to remind me.  After detox can consider use of naltrexone to help maintain abstinence but this would be a adjunctive treatment to AA or the equivalent.

## 2012-04-27 ENCOUNTER — Telehealth: Payer: Self-pay | Admitting: *Deleted

## 2012-04-27 NOTE — Telephone Encounter (Signed)
Called pt and told him per Dr Debby Bud, that he can provide Rx for ativan at wife's visit - she will need to remind me.  After detox can consider use of naltrexone to help maintain abstinence but this would be a adjunctive treatment to AA or the equivalent. Pt understood.

## 2012-05-03 ENCOUNTER — Other Ambulatory Visit: Payer: Self-pay | Admitting: Internal Medicine

## 2012-05-03 MED ORDER — LORAZEPAM 0.5 MG PO TABS: 0.5000 mg | ORAL_TABLET | ORAL | Status: DC | PRN

## 2012-05-21 ENCOUNTER — Ambulatory Visit (INDEPENDENT_AMBULATORY_CARE_PROVIDER_SITE_OTHER): Payer: PRIVATE HEALTH INSURANCE | Admitting: Internal Medicine

## 2012-05-21 ENCOUNTER — Encounter: Payer: Self-pay | Admitting: Internal Medicine

## 2012-05-21 VITALS — BP 124/70 | HR 89 | Temp 98.1°F | Resp 10 | Wt 211.0 lb

## 2012-05-21 DIAGNOSIS — F1010 Alcohol abuse, uncomplicated: Secondary | ICD-10-CM

## 2012-05-21 NOTE — Assessment & Plan Note (Signed)
Has been abstemious since Jan 1! He reports some tremor but no serious signs of withdrawal. He has been using lorazepam on a regular basis which has been helpful. He has not been to AA mtg yet.  Plan Re-enforced his recovery  May refill lorazepam  Strongly encourage him to attend 2-3 meetings a week at least for the next several months. He should get a sponsor.

## 2012-05-21 NOTE — Progress Notes (Signed)
  Subjective:    Patient ID: Rodney [name], male    DOB: 1960-07-20, 51 y.o.   MRN: [id number]  HPI Mr. Rodney [name] presents for follow up on EtOH abuse. He has been abstemious since Jan 1.  PMH, FamHx and SocHx reviewed for any changes and relevance. Current Outpatient Prescriptions on File Prior to Visit  Medication Sig Dispense Refill  . ALPRAZolam (XANAX) 1 MG tablet Take 1 tablet (1 mg total) by mouth at bedtime as needed for sleep.  90 tablet  3  . hydrochlorothiazide (HYDRODIURIL) 25 MG tablet Take 1 tablet (25 mg total) by mouth daily.  90 tablet  3  . LORazepam (ATIVAN) 0.5 MG tablet Take 1 tablet (0.5 mg total) by mouth as needed for anxiety. See written instruction.  100 tablet  1  . Garcinia Cambogia-Chromium 500-200 MG-MCG TABS Take 1 tablet by mouth 3 (three) times daily.          Review of Systems System review is negative for any constitutional, cardiac, pulmonary, GI or neuro symptoms or complaints other than as described in the HPI.     Objective:   Physical Exam Filed Vitals:   05/21/12 1503  BP: 124/70  Pulse: 89  Temp: 98.1 F (36.7 C)  Resp: 10   gen'l- WNWD well groomed calm white man Cor- 2+ radial RRR Pulm - normal respirations Neuro - calm, no tremor.       Assessment & Plan:

## 2012-06-11 ENCOUNTER — Encounter: Payer: PRIVATE HEALTH INSURANCE | Admitting: Internal Medicine

## 2012-06-30 ENCOUNTER — Other Ambulatory Visit: Payer: Self-pay

## 2012-10-02 ENCOUNTER — Other Ambulatory Visit: Payer: Self-pay | Admitting: Internal Medicine

## 2012-10-03 NOTE — Telephone Encounter (Signed)
Needs OV w/Dr Norins 

## 2012-10-04 NOTE — Telephone Encounter (Signed)
Alprazolam called to pharmacy  

## 2012-10-09 ENCOUNTER — Emergency Department (INDEPENDENT_AMBULATORY_CARE_PROVIDER_SITE_OTHER)
Admission: EM | Admit: 2012-10-09 | Discharge: 2012-10-09 | Disposition: A | Discharge status: Home / Self Care | Payer: PRIVATE HEALTH INSURANCE | Source: Home / Self Care | Attending: Emergency Medicine | Admitting: Emergency Medicine

## 2012-10-09 ENCOUNTER — Ambulatory Visit: Payer: PRIVATE HEALTH INSURANCE | Admitting: Internal Medicine

## 2012-10-09 ENCOUNTER — Encounter (HOSPITAL_COMMUNITY): Payer: Self-pay | Admitting: *Deleted

## 2012-10-09 DIAGNOSIS — T148 Other injury of unspecified body region: Secondary | ICD-10-CM

## 2012-10-09 DIAGNOSIS — W57XXXA Bitten or stung by nonvenomous insect and other nonvenomous arthropods, initial encounter: Secondary | ICD-10-CM

## 2012-10-09 DIAGNOSIS — L237 Allergic contact dermatitis due to plants, except food: Secondary | ICD-10-CM

## 2012-10-09 MED ORDER — DOXYCYCLINE HYCLATE 100 MG PO TABS: 100.0000 mg | ORAL_TABLET | Freq: Two times a day (BID) | ORAL | Status: DC

## 2012-10-09 MED ORDER — TRIAMCINOLONE ACETONIDE 0.1 % EX CREA: TOPICAL_CREAM | Freq: Three times a day (TID) | CUTANEOUS | Status: DC

## 2012-10-09 NOTE — ED Provider Notes (Signed)
Chief Complaint:   Chief Complaint  Patient presents with  . Rash    History of Present Illness:   Rodney [name] is here today for a variety of skin issues. The first is that his wife removed a small tic from his last CVA area about 2 days ago. It was the size of a pinhead. This has subsequently developed a halo of erythema and is tender to touch, but it's not been draining any pus. Second of all, he has a rash on his right shoulder blade and and right hand which he attributes to exposure to poison ivy. He was mowing the lawn without a shirt and thinks he might of burst up against a vine. He has some blistering in those areas. Thirdly, has a couple of small erythematous bumps on both posterior thighs which are tender to touch. He has not seen anything biting him in these areas, but he did mow the lawn with short pants. He's felt a little bit dizzy and tremulous. He denies any fever, chills, headache, muscle aches, or generalized rash.  Review of Systems:  Other than noted above, the patient denies any of the following symptoms: Systemic:  No fever, chills, sweats, weight loss, or fatigue. ENT:  No nasal congestion, rhinorrhea, sore throat, swelling of lips, tongue or throat. Resp:  No cough, wheezing, or shortness of breath. Skin:  No rash, itching, nodules, or suspicious lesions.  PMFSH:  Past medical history, family history, social history, meds, and allergies were reviewed.  Physical Exam:   Vital signs:  BP 152/94  Pulse 76  Temp(Src) 98.5 F (36.9 C) (Oral)  Resp 16  SpO2 95% Gen:  Alert, oriented, in no distress. ENT:  Pharynx clear, no intraoral lesions, moist mucous membranes. Lungs:  Clear to auscultation. Skin:  In the left CVA area there is a small scab with an area of surrounding erythema measuring 5 cm. This was mildly tender to palpation. He has small streaks and patches of vesicles on the right scapula and right index finger. On the erythematous area in the right scapula  there was a tiny tick attached. This was pale tan in color and the size of a pinhead. Finally, he has 2 small scabs, one on each posterior thigh with a 1.5 cm area of surrounding erythema. There was no drainage. These were mildly tender to palpation. He has a few small red streaks on his left upper arm as well but otherwise no skin rash.  Course in Urgent Care Center:   The small tic in the right scapular area was prepped with alcohol and removed with a forceps. The tick came off easily and did not leave any visible mouth parts behind.  Assessment:  The primary encounter diagnosis was Tick bite. Diagnoses of Insect bites and Poison ivy were also pertinent to this visit.  The rash on the right scapula and right index finger are typical for poison ivy, so will treat with triamcinolone cream. The tick bite in the left CVA area appears to be infected, also the bites on the posterior thighs could be tick bites, mosquito, or chigger bites and these appear to be mildly infected as well. I'm going to go ahead and treat him with doxycycline. As of right now there is no evidence of [facility] Spotted Fever, Lyme disease, or other tick borne illnesses.  Plan:   1.  The following meds were prescribed:   Discharge Medication List as of 10/09/2012 10:51 AM    START taking  these medications   Details  doxycycline (VIBRA-TABS) 100 MG tablet Take 1 tablet (100 mg total) by mouth 2 (two) times daily., Starting 10/09/2012, Until Discontinued, Normal    triamcinolone cream (KENALOG) 0.1 % Apply topically 3 (three) times daily., Starting 10/09/2012, Until Discontinued, Normal       2.  The patient was instructed in symptomatic care and handouts were given. 3.  The patient was told to return if becoming worse in any way, if no better in 3 or 4 days, and given some red flag symptoms such as fever, headache, myalgias, flulike symptoms, or generalized skin rash that would indicate earlier return. 4.  Follow up here if he  should become worse in any way.     Reuben Likes, MD 10/09/12 [phone]

## 2012-10-09 NOTE — ED Notes (Signed)
Pt  Has  A  Rash       On  Back        And      r  Hand    Had  A  Tick  Removed  r  Lower  Back  2  Days  Ago          Was  Working outside  [employer]     No  Fever  But  Reports  dizzyness

## 2012-10-09 NOTE — Discharge Instructions (Signed)
Watch for signs of tick borne illness (fever, chills, headache, muscle aches, or generalized rash)--return right away.   Deer Tick Bite Deer ticks are brown arachnids (spider family) that vary in size from as small as the head of a pin to 1/4 inch (1/2 cm) diameter. They thrive in wooded areas. Deer are the preferred host of adult deer ticks. Small rodents are the host of young ticks (nymphs). When a person walks in a field or wooded area, young and adult ticks in the surrounding grass and vegetation can attach themselves to the skin. They can suck blood for hours to days if unnoticed. Ticks are found all over the [place] Some ticks carry a specific bacteria (Borrelia burgdorferi) that causes an infection called Lyme disease. The bacteria is typically passed into a person during the blood sucking process. This happens after the tick has been attached for at least a number of hours. While ticks can be found all over the [place] those carrying the bacteria that causes Lyme disease are most common in [place] and the [place]. Only a small proportion of ticks in these areas carry the Lyme disease bacteria and cause human infections. Ticks usually attach to warm spots on the body, such as the:  Head.  Back.  Neck.  Armpits.  Groin. SYMPTOMS  Most of the time, a deer tick bite will not be felt. You may or may not see the attached tick. You may notice mild irritation or redness around the bite site. If the deer tick passes the Lyme disease bacteria to a person, a round, red rash may be noticed 2 to 3 days after the bite. The rash may be clear in the middle, like a bull's-eye or target. If not treated, other symptoms may develop several days to weeks after the onset of the rash. These symptoms may include:  New rash lesions.  Fatigue and weakness.  General ill feeling and achiness.  Chills.  Headache and neck pain.  Swollen lymph glands.  Sore muscles and joints. 5 to 15% of untreated people  with Lyme disease may develop more severe illnesses after several weeks to months. This may include inflammation of the brain lining (meningitis), nerve palsies, an abnormal heartbeat, or severe muscle and joint pain and inflammation (myositis or arthritis). DIAGNOSIS   Physical exam and medical history.  Viewing the tick if it was saved for confirmation.  Blood tests (to check or confirm the presence of Lyme disease). TREATMENT  Most ticks do not carry disease. If found, an attached tick should be removed using tweezers. Tweezers should be placed under the body of the tick so it is removed by its attachment parts (pincers). If there are signs or symptoms of being sick, or Lyme disease is confirmed, medicines (antibiotics) that kill germs are usually prescribed. In more severe cases, antibiotics may be given through an intravenous (IV) access. HOME CARE INSTRUCTIONS   Always remove ticks with tweezers. Do not use petroleum jelly or other methods to kill or remove the tick. Slide the tweezers under the body and pull out as much as you can. If you are not sure what it is, save it in a jar and show your caregiver.  Once you remove the tick, the skin will heal on its own. Wash your hands and the affected area with water and soap. You may place a bandage on the affected area.  Take medicine as directed. You may be advised to take a full course of antibiotics.  Follow  up with your caregiver as recommended. FINDING OUT THE RESULTS OF YOUR TEST Not all test results are available during your visit. If your test results are not back during the visit, make an appointment with your caregiver to find out the results. Do not assume everything is normal if you have not heard from your caregiver or the medical facility. It is important for you to follow up on all of your test results. PROGNOSIS  If Lyme disease is confirmed, early treatment with antibiotics is very effective. Following preventive guidelines  is important since it is possible to get the disease more than once. PREVENTION   Wear long sleeves and long pants in wooded or grassy areas. Tuck your pants into your socks.  Use an insect repellent while hiking.  Check yourself, your children, and your pets regularly for ticks after playing outside.  Clear piles of leaves or brush from your yard. Ticks might live there. SEEK MEDICAL CARE IF:   You or your child has an oral temperature above 102 F (38.9 C).  You develop a severe headache following the bite.  You feel generally ill.  You notice a rash.  You are having trouble removing the tick.  The bite area has red skin or yellow drainage. SEEK IMMEDIATE MEDICAL CARE IF:   Your face is weak and droopy or you have other neurological symptoms.  You have severe joint pain or weakness. MAKE SURE YOU:   Understand these instructions.  Will watch your condition.  Will get help right away if you are not doing well or get worse. FOR MORE INFORMATION Centers for Disease Control and Prevention: FootballExhibition.com.br American Academy of [employer]icians: www.https://powers.com/ Document Released: 07/27/2009 Document Revised: 07/25/2011 Document Reviewed: 07/27/2009 [facility] Patient Information 2014 [place], [place].  Poison [employer] ivy is a inflammation of the skin (contact dermatitis) caused by touching the allergens on the leaves of the ivy plant following previous exposure to the plant. The rash usually appears 48 hours after exposure. The rash is usually bumps (papules) or blisters (vesicles) in a linear pattern. Depending on your own sensitivity, the rash may simply cause redness and itching, or it may also progress to blisters which may break open. These must be well cared for to prevent secondary bacterial (germ) infection, followed by scarring. Keep any open areas dry, clean, dressed, and covered with an antibacterial ointment if needed. The eyes may also get puffy. The puffiness is worst in  the morning and gets better as the day progresses. This dermatitis usually heals without scarring, within 2 to 3 weeks without treatment. HOME CARE INSTRUCTIONS  Thoroughly wash with soap and water as soon as you have been exposed to poison ivy. You have about one half hour to remove the plant resin before it will cause the rash. This washing will destroy the oil or antigen on the skin that is causing, or will cause, the rash. Be sure to wash under your fingernails as any plant resin there will continue to spread the rash. Do not rub skin vigorously when washing affected area. Poison ivy cannot spread if no oil from the plant remains on your body. A rash that has progressed to weeping sores will not spread the rash unless you have not washed thoroughly. It is also important to wash any clothes you have been wearing as these may carry active allergens. The rash will return if you wear the unwashed clothing, even several days later. Avoidance of the plant in the future is the  best measure. Poison ivy plant can be recognized by the number of leaves. Generally, poison ivy has three leaves with flowering branches on a single stem. Diphenhydramine may be purchased over the counter and used as needed for itching. Do not drive with this medication if it makes you drowsy.Ask your caregiver about medication for children. SEEK MEDICAL CARE IF:  Open sores develop.  Redness spreads beyond area of rash.  You notice purulent (pus-like) discharge.  You have increased pain.  Other signs of infection develop (such as fever). Document Released: 04/29/2000 Document Revised: 07/25/2011 Document Reviewed: 03/18/2009 [facility] Patient Information 2014 [place], [place].

## 2012-11-08 ENCOUNTER — Other Ambulatory Visit: Payer: Self-pay | Admitting: Internal Medicine

## 2012-11-09 NOTE — Telephone Encounter (Signed)
Will you please refill in Dr Plotnikov's absence?

## 2012-11-09 NOTE — Telephone Encounter (Signed)
Alprazolam has been called to [facility]

## 2012-11-12 ENCOUNTER — Other Ambulatory Visit: Payer: Self-pay | Admitting: Internal Medicine

## 2013-01-21 ENCOUNTER — Other Ambulatory Visit: Payer: Self-pay | Admitting: Internal Medicine

## 2013-01-24 ENCOUNTER — Other Ambulatory Visit: Payer: Self-pay | Admitting: Internal Medicine

## 2013-01-28 NOTE — Telephone Encounter (Addendum)
Script has been faxed to [facility] [phone]

## 2013-03-21 ENCOUNTER — Other Ambulatory Visit: Payer: Self-pay

## 2013-03-27 ENCOUNTER — Encounter: Payer: Self-pay | Admitting: Internal Medicine

## 2013-03-27 ENCOUNTER — Other Ambulatory Visit: Payer: Self-pay

## 2013-03-27 ENCOUNTER — Other Ambulatory Visit: Payer: Self-pay | Admitting: Internal Medicine

## 2013-03-27 MED ORDER — ALPRAZOLAM 1 MG PO TABS: ORAL_TABLET | ORAL | Status: DC

## 2013-03-27 NOTE — Telephone Encounter (Signed)
Will need follow up office visit

## 2013-03-28 NOTE — Telephone Encounter (Signed)
Alprazolam called to pharmacy  

## 2013-05-03 ENCOUNTER — Other Ambulatory Visit: Payer: Self-pay | Admitting: Internal Medicine

## 2013-07-01 ENCOUNTER — Encounter: Payer: Self-pay | Admitting: Internal Medicine

## 2013-07-03 MED ORDER — ALPRAZOLAM 1 MG PO TABS
ORAL_TABLET | ORAL | Status: DC
Start: 2013-07-03 — End: 2013-09-30

## 2013-09-30 ENCOUNTER — Other Ambulatory Visit: Payer: Self-pay

## 2013-09-30 ENCOUNTER — Encounter: Payer: Self-pay | Admitting: Internal Medicine

## 2013-09-30 MED ORDER — ALPRAZOLAM 1 MG PO TABS: ORAL_TABLET | ORAL | Status: DC

## 2013-11-11 ENCOUNTER — Encounter: Payer: Self-pay | Admitting: Internal Medicine

## 2013-11-12 MED ORDER — ALPRAZOLAM 1 MG PO TABS: ORAL_TABLET | ORAL | Status: DC

## 2014-01-21 ENCOUNTER — Other Ambulatory Visit (INDEPENDENT_AMBULATORY_CARE_PROVIDER_SITE_OTHER): Payer: PRIVATE HEALTH INSURANCE

## 2014-01-21 ENCOUNTER — Ambulatory Visit (INDEPENDENT_AMBULATORY_CARE_PROVIDER_SITE_OTHER): Payer: PRIVATE HEALTH INSURANCE | Admitting: Internal Medicine

## 2014-01-21 ENCOUNTER — Encounter: Payer: Self-pay | Admitting: Internal Medicine

## 2014-01-21 VITALS — BP 140/100 | HR 78 | Temp 98.4°F | Resp 12 | Ht 69.0 in | Wt 206.1 lb

## 2014-01-21 DIAGNOSIS — F411 Generalized anxiety disorder: Secondary | ICD-10-CM

## 2014-01-21 DIAGNOSIS — I10 Essential (primary) hypertension: Secondary | ICD-10-CM

## 2014-01-21 DIAGNOSIS — Z720 Tobacco use: Secondary | ICD-10-CM

## 2014-01-21 DIAGNOSIS — F17200 Nicotine dependence, unspecified, uncomplicated: Secondary | ICD-10-CM

## 2014-01-21 DIAGNOSIS — F1010 Alcohol abuse, uncomplicated: Secondary | ICD-10-CM

## 2014-01-21 LAB — BASIC METABOLIC PANEL
BUN: 13 mg/dL (ref 6–23)
CALCIUM: 9.6 mg/dL (ref 8.4–10.5)
CO2: 29 meq/L (ref 19–32)
Chloride: 95 mEq/L — ABNORMAL LOW (ref 96–112)
Creatinine, Ser: 0.8 mg/dL (ref 0.4–1.5)
GFR: 109.95 mL/min (ref >60.00)
Glucose, Bld: 82 mg/dL (ref 70–99)
Potassium: 3.3 mEq/L — ABNORMAL LOW (ref 3.5–5.1)
SODIUM: 135 meq/L (ref 135–145)

## 2014-01-21 NOTE — Progress Notes (Signed)
Pre visit review using our clinic review tool, if applicable. No additional management support is needed unless otherwise documented below in the visit note. 

## 2014-01-21 NOTE — Patient Instructions (Signed)
We would like you to stop taking the alprazolam daily and only use it if needed. If you notice that you are needing it daily then we need to start a different medicine to help with your anxiety as alprazolam is not safe to continue taking for many years as it can cause problems with confusion, memory problems, increased risk of falling or losing your balance.   Think about getting a colonoscopy this year as it is a very good screening for colon cancer.   Talk to your wife about the two of you stopping smoking to help save money and to improve your health. Cigarettes can raise your blood pressure, hurt your lungs, hurt your heart. Cigarettes can also increase your anxiety.   Colonoscopy A colonoscopy is an exam to look at your colon. This exam can help find lumps (tumors), growths (polyps), bleeding, and redness and puffiness (inflammation) in your colon.  BEFORE THE PROCEDURE  Ask your doctor about changing or stopping your regular medicines.  You may need to drink a large amount of a special liquid (oral bowel prep). You start drinking this the day before your procedure. It will cause you to have watery poop (stool). This cleans out your colon.  Do not eat or drink anything else once you have started the bowel prep, unless your doctor tells you it is safe to do so.  Make plans for someone to drive you home after the procedure. PROCEDURE  You will be given medicine to help you relax (sedative).  You will lie on your side with your knees bent.  A tube with a camera on the end is put in the opening of your butt (anus) and into your colon. Pictures are sent to a computer screen. Your doctor will look for anything that is not normal.  Your doctor may take a tissue sample (biopsy) from your colon to be looked at more closely.  The exam is finished when your doctor has viewed all of the colon. AFTER THE PROCEDURE  Do not drive for 24 hours after the exam.  You may have a small amount of  blood in your poop. This is normal.  You may pass gas and have belly (abdominal) cramps. This is normal.  Ask when your test results will be ready. Make sure you get your test results. Document Released: 06/04/2010 Document Revised: 05/07/2013 Document Reviewed: 01/07/2013 [facility] Patient Information 2015 [place], [place]. This information is not intended to replace advice given to you by your health care provider. Make sure you discuss any questions you have with your health care provider.

## 2014-01-21 NOTE — Assessment & Plan Note (Signed)
Patient is drinking again and admits to 2-4 beers per night with more alcohol on the weekends. Did discuss cutting back and he will think about it. Talked to him about adverse health affects including high blood pressure, heart and liver disease. He does not currently have license to drive and is working without excessive absences.

## 2014-01-21 NOTE — Progress Notes (Signed)
   Subjective:    Patient ID: Rodney [name], male    DOB: 05/01/1961, 53 y.o.   MRN: [id number]  HPI The patient is a 53 YO man with PMH of alcohol abuse, anxiety with panic, HTN, tobacco use who is coming in to establish care. He has been taking his blood pressure medication regularly without problem. He admits that his previous sobriety is no longer and he drinks 2-4 beers per night. He does also admit to drinking more on the weekends and drank particularly a lot this weekend. He does occasionally get shaking of his hands in the morning after drinking although he does not seem to find it a problem. He also uses his xanax in the morning every morning in case he has anxiety and he then sometimes needs to take another 1 in the day. He states that he mostly has pills left over at the end of the month (he currently gets 90 per month). He denies chest pain, SOB, nausea, vomiting, diarrhea, constipation.   Review of Systems  Constitutional: Negative for fever, activity change, appetite change and fatigue.  Respiratory: Negative for cough, chest tightness, shortness of breath and wheezing.   Cardiovascular: Negative for chest pain, palpitations and leg swelling.  Gastrointestinal: Negative for abdominal pain, diarrhea, constipation and abdominal distention.  Musculoskeletal: Negative for arthralgias and back pain.  Neurological: Negative for dizziness, weakness, light-headedness and headaches.  Psychiatric/Behavioral: Negative for hallucinations, dysphoric mood and decreased concentration. The patient is nervous/anxious. The patient is not hyperactive.        Objective:   Physical Exam  Nursing note and vitals reviewed. Constitutional: He is oriented to person, place, and time. He appears well-developed and well-nourished.  HENT:  Head: Normocephalic and atraumatic.  Eyes: EOM are normal.  Neck: Normal range of motion. Neck supple. No JVD present. No thyromegaly present.  Cardiovascular: Normal  rate, regular rhythm and intact distal pulses.   No murmur heard. Pulmonary/Chest: Effort normal and breath sounds normal.  Abdominal: Soft. Bowel sounds are normal. He exhibits no distension. There is no tenderness. There is no rebound.  Musculoskeletal: He exhibits no tenderness.  Neurological: He is alert and oriented to person, place, and time. Coordination normal.  Skin: Skin is warm and dry.    Filed Vitals:   01/21/14 1250 01/21/14 1319  BP: 160/100 140/100  Pulse: 78   Temp: 98.4 F (36.9 C)   TempSrc: Oral   Resp: 12   Height:  (1.753 m)   Weight: 206 lb 1.9 oz (93.495 kg)   SpO2: 95%       Assessment & Plan:   Spoke with patient about colonoscopy and he wants more time to think about it. He declines flu shot at today's visit.

## 2014-01-21 NOTE — Assessment & Plan Note (Signed)
Smoking about 1/2 PPD and advised cessation and discussed the adverse effects of cigarette smoking on his health. He will think about it.

## 2014-01-21 NOTE — Assessment & Plan Note (Signed)
Patient's BP slightly elevated at today's visit. Patient thinks this could be because of the bender he was on this weekend. Talked to him about limiting alcohol intake daily and especially when he binges on the weekends as this has adverse effects on his health. Checking BMP at today's visit and may need to adjust medication if BP still elevated at next visit.

## 2014-01-21 NOTE — Assessment & Plan Note (Signed)
Spoke with patient about the fact that alcohol mixed with benzos can have compounded adverse effects and let him know that I am not comfortable prescribing his xanax as frequently as Dr. Debby Bud. He was informed that he should try to only take the xanax when he is having anxiety. He is not sure he wants to try that. If he is unable to limit his own usage he will need to start SSRI as I do not prescribe long term chronic benzos without concomitant SSRI therapy. Adverse effects described to him include but not limited to confusion, memory impairment, imbalance and fall risk increased.   Will rx xanax #45 per month for 2-3 months to see how he tolerates, and will likely need to start SSRI at next visit.

## 2014-01-23 MED ORDER — HYDROCHLOROTHIAZIDE 12.5 MG PO TABS: 12.5000 mg | ORAL_TABLET | Freq: Every day | ORAL | Status: DC

## 2014-02-28 ENCOUNTER — Other Ambulatory Visit: Payer: Self-pay

## 2014-04-02 IMAGING — CR DG CHEST 2V
2 series · 2 of 2 positions shown · non-contrast
Comparison: None.

CLINICAL DATA: Smoking history

CHEST - 2 VIEW

[view not recorded (1 of 2)]
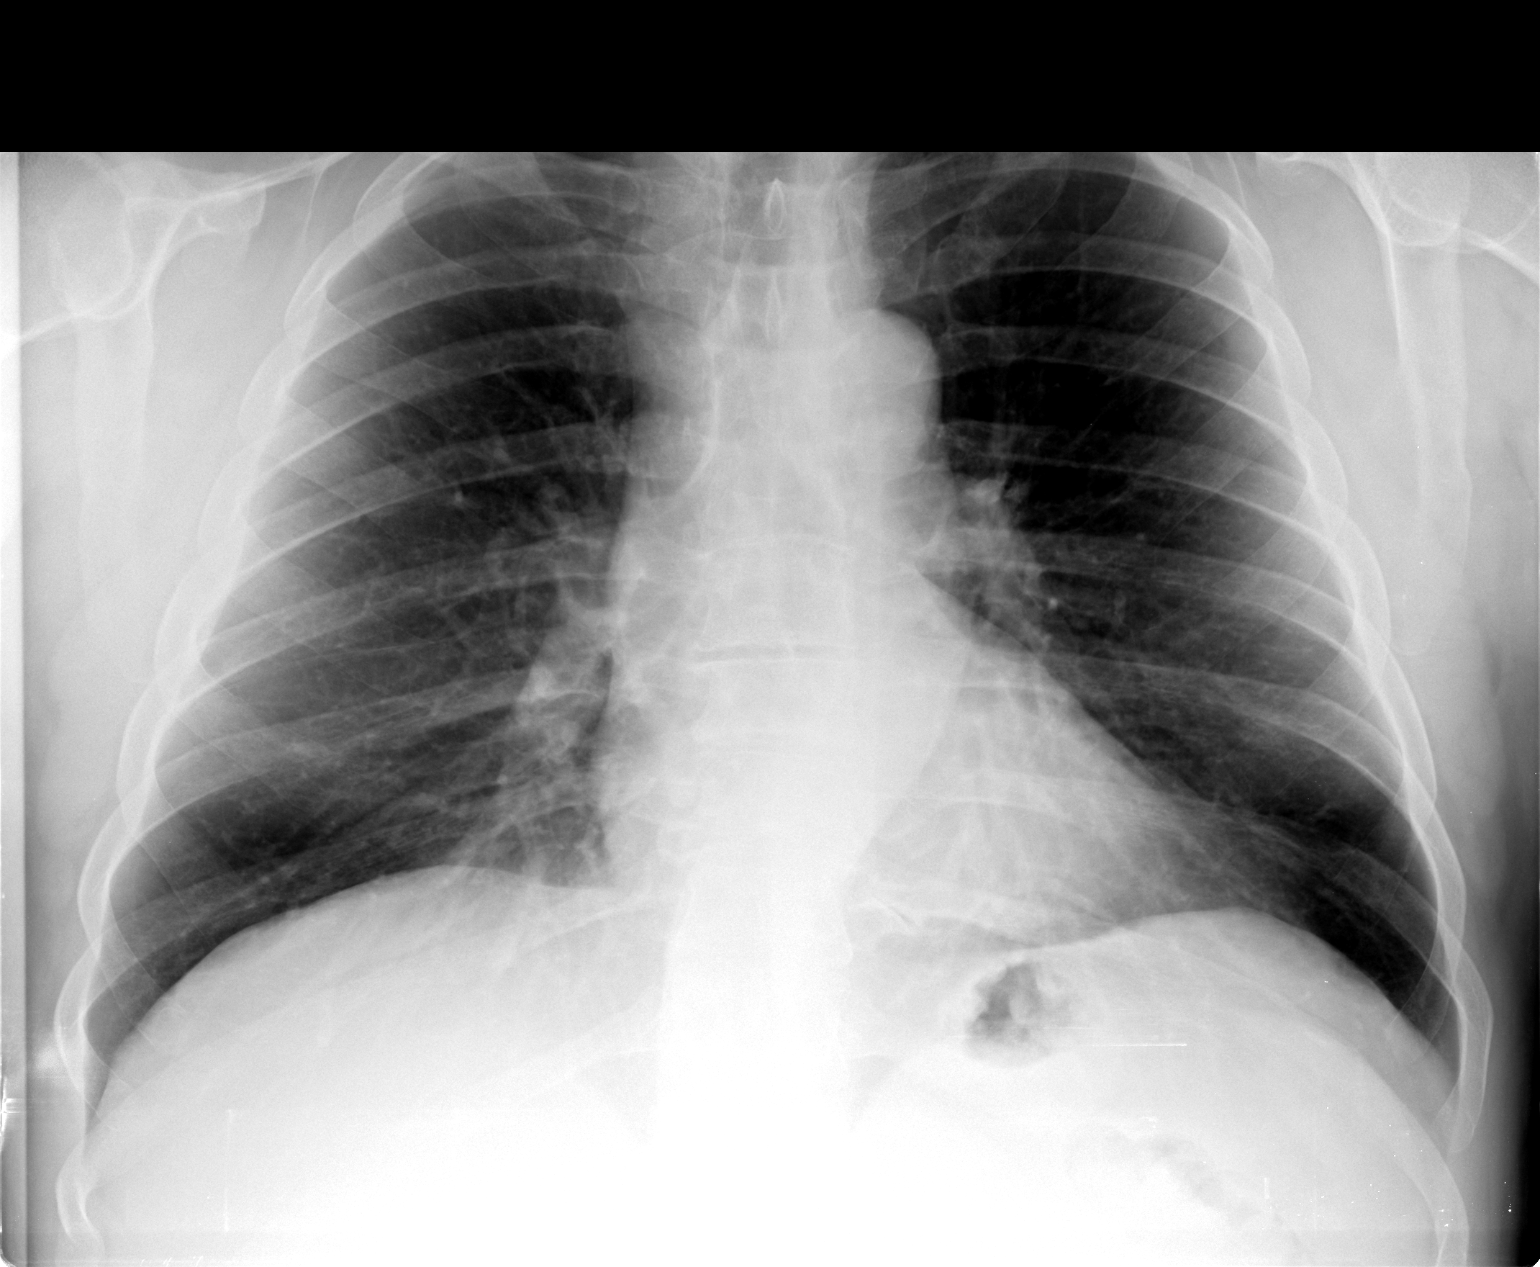

[view not recorded (2 of 2)]
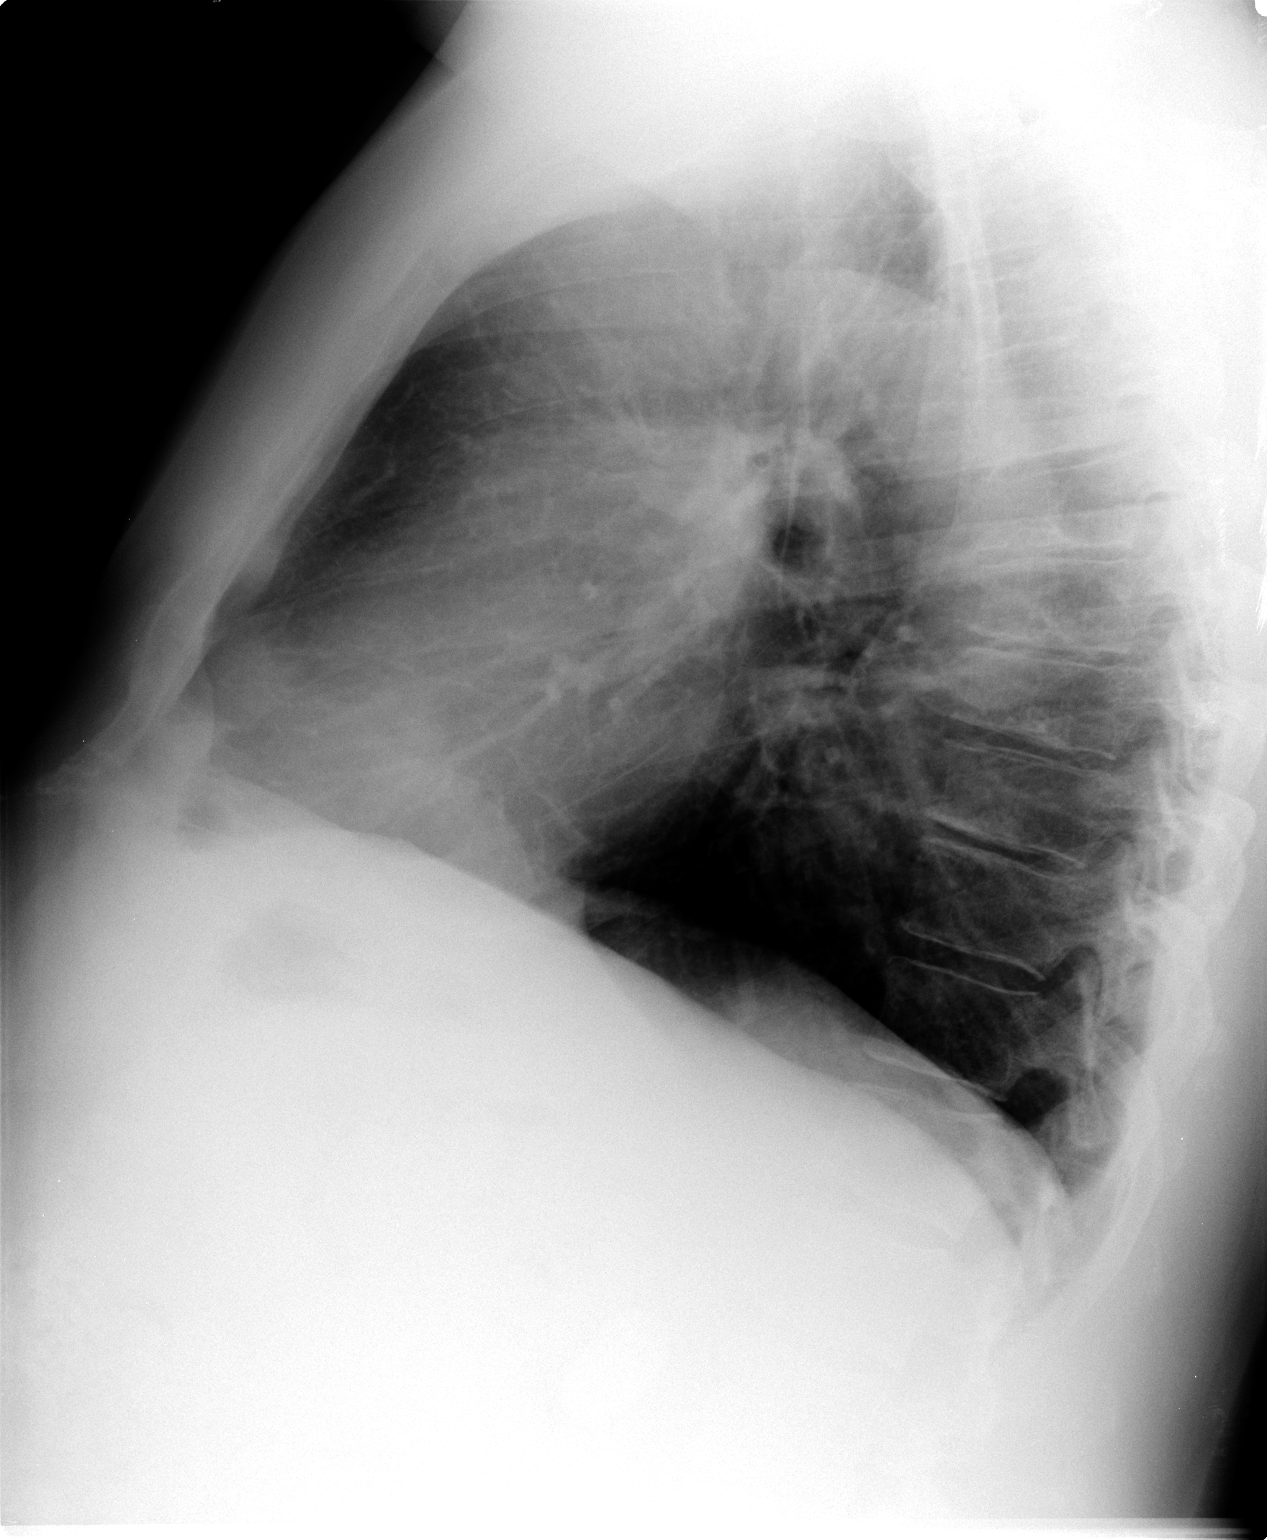

[2 of 2 positions shown; findings below may reference images not displayed]

FINDINGS: There is a degree of underlying emphysema.  There is no
edema or consolidation.  The heart size is normal.  There is some
diminished vascularity in the upper lobes, probably due to
underlying upper lobe emphysematous change.  There is no
adenopathy.  Aorta is mildly tortuous.  No bone lesions.
IMPRESSION: There is a degree of underlying emphysema.  No edema
or consolidation.

## 2014-04-30 ENCOUNTER — Other Ambulatory Visit: Payer: Self-pay | Admitting: Geriatric Medicine

## 2014-04-30 ENCOUNTER — Encounter: Payer: Self-pay | Admitting: Internal Medicine

## 2014-04-30 MED ORDER — ALPRAZOLAM 1 MG PO TABS: 1.0000 mg | ORAL_TABLET | Freq: Two times a day (BID) | ORAL | Status: DC | PRN

## 2014-05-13 ENCOUNTER — Other Ambulatory Visit: Payer: Self-pay | Admitting: Internal Medicine

## 2014-05-13 MED ORDER — HYDROCHLOROTHIAZIDE 12.5 MG PO TABS: 12.5000 mg | ORAL_TABLET | Freq: Every day | ORAL | Status: DC

## 2014-05-14 ENCOUNTER — Other Ambulatory Visit: Payer: Self-pay | Admitting: Geriatric Medicine

## 2014-05-14 MED ORDER — HYDROCHLOROTHIAZIDE 12.5 MG PO TABS: 12.5000 mg | ORAL_TABLET | Freq: Every day | ORAL | Status: DC

## 2014-05-26 ENCOUNTER — Telehealth: Payer: Self-pay | Admitting: Internal Medicine

## 2014-05-26 ENCOUNTER — Other Ambulatory Visit: Payer: Self-pay | Admitting: Internal Medicine

## 2014-05-26 ENCOUNTER — Encounter: Payer: Self-pay | Admitting: Internal Medicine

## 2014-05-26 MED ORDER — ALPRAZOLAM 1 MG PO TABS: 1.0000 mg | ORAL_TABLET | Freq: Two times a day (BID) | ORAL | Status: DC | PRN

## 2014-05-26 MED ORDER — PAROXETINE HCL 20 MG PO TABS: 20.0000 mg | ORAL_TABLET | Freq: Every day | ORAL | Status: DC

## 2014-05-26 NOTE — Telephone Encounter (Signed)
Spoke with patient and he understands that he should not be taking xanax everyday. He picked up the paroxetin and will try it.

## 2014-05-26 NOTE — Telephone Encounter (Signed)
Can you please call him and explain that we do prescribe only xanax for anxiety and it is like a temporary band aid that covers up anxiety and that if he is needing it more than once a day we need to try another strategy to work on the underlying anxiety and not just cover it up with medicine. Can refill for #45 which is to last 1 month and will send in a medicine to use for him to take everyday which should start to treat his underlying anxiety called paroxetine. The goal is for him to be anxiety free and free from needing the xanax. If this is not acceptable for him he needs to schedule visit to discuss.

## 2014-05-30 ENCOUNTER — Encounter: Payer: Self-pay | Admitting: Internal Medicine

## 2014-08-30 ENCOUNTER — Other Ambulatory Visit: Payer: Self-pay | Admitting: Internal Medicine

## 2014-09-01 ENCOUNTER — Encounter: Payer: Self-pay | Admitting: Internal Medicine

## 2014-09-01 ENCOUNTER — Other Ambulatory Visit: Payer: Self-pay | Admitting: *Deleted

## 2014-09-01 MED ORDER — PAROXETINE HCL 20 MG PO TABS: 20.0000 mg | ORAL_TABLET | Freq: Every day | ORAL | Status: DC

## 2014-09-01 NOTE — Telephone Encounter (Signed)
Pt sent email needing refill sent on his paxil...Raechel Chute/lmb

## 2014-10-24 ENCOUNTER — Encounter: Payer: Self-pay | Admitting: Internal Medicine

## 2014-10-24 MED ORDER — ALPRAZOLAM 1 MG PO TABS: 1.0000 mg | ORAL_TABLET | Freq: Two times a day (BID) | ORAL | Status: DC | PRN

## 2014-10-28 NOTE — Telephone Encounter (Signed)
Please call pharmacy and check. Refax if needed.

## 2014-10-29 ENCOUNTER — Encounter: Payer: Self-pay | Admitting: Internal Medicine

## 2014-12-05 ENCOUNTER — Encounter: Payer: Self-pay | Admitting: Internal Medicine

## 2014-12-05 ENCOUNTER — Other Ambulatory Visit: Payer: Self-pay | Admitting: Geriatric Medicine

## 2014-12-05 MED ORDER — PAROXETINE HCL 20 MG PO TABS: 20.0000 mg | ORAL_TABLET | Freq: Every day | ORAL | Status: DC

## 2017-10-17 ENCOUNTER — Other Ambulatory Visit: Payer: Self-pay

## 2017-10-17 ENCOUNTER — Encounter (HOSPITAL_COMMUNITY): Payer: Self-pay | Admitting: Emergency Medicine

## 2017-10-17 ENCOUNTER — Ambulatory Visit (HOSPITAL_COMMUNITY)
Admission: EM | Admit: 2017-10-17 | Discharge: 2017-10-17 | Disposition: A | Discharge status: Home / Self Care | Payer: PRIVATE HEALTH INSURANCE | Attending: Emergency Medicine | Admitting: Emergency Medicine

## 2017-10-17 DIAGNOSIS — L03116 Cellulitis of left lower limb: Secondary | ICD-10-CM | POA: Diagnosis not present

## 2017-10-17 DIAGNOSIS — W57XXXA Bitten or stung by nonvenomous insect and other nonvenomous arthropods, initial encounter: Secondary | ICD-10-CM

## 2017-10-17 MED ORDER — DOXYCYCLINE HYCLATE 100 MG PO CAPS: 100.0000 mg | ORAL_CAPSULE | Freq: Two times a day (BID) | ORAL | 0 refills | Status: AC

## 2017-10-17 NOTE — ED Provider Notes (Signed)
[facility]    CSN: [id number] Arrival date & time: 10/17/17  1704     History   Chief Complaint Chief Complaint  Patient presents with  . Insect Bite    HPI Rodney [name] is a 57 y.o. male history of tobacco use, hypertension presenting today for evaluation of left foot swelling and redness.  He states that he found a tick on his 3 days ago.  He believes it was only attached for less than a day.  Today after he took his shoes off from work he had significant redness and swelling as well as drainage from the area of the tick bite.  He has been using Benadryl.  Denies any recent immobilization/hospitalizations/surgeries.  Denies previous DVT/PE.  Denies history of cancer.  HPI  Past Medical History:  Diagnosis Date  . Anxiety state, unspecified   . Calculus of kidney   . Lumbago   . Personal history of other diseases of digestive system     Patient Active Problem List   Diagnosis Date Noted  . Essential hypertension, benign 01/21/2014  . Tobacco abuse 04/17/2012  . ALCOHOL ABUSE 08/14/2008  . ANXIETY 05/05/2007  . PANIC ATTACK 05/05/2007  . 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 DIVERTICULITIS, HX OF 05/05/2007    History reviewed. No pertinent surgical history.     Home Medications    Prior to Admission medications   Medication Sig Start Date End Date Taking? Authorizing Provider  doxycycline (VIBRAMYCIN) 100 MG capsule Take 1 capsule (100 mg total) by mouth 2 (two) times daily for 10 days. 10/17/17 10/27/17  Bradyn Vassey, Junius Creamer, PA-C    Family History Family History  Problem Relation Age of Onset  . Cancer Neg Hx   . Heart disease Neg Hx   . Hyperlipidemia Neg Hx   . Hypertension Neg Hx   . Kidney disease Neg Hx   . Stroke Neg Hx     Social History Social History   Tobacco Use  . Smoking status: Current Every Day Smoker    Packs/day: 0.50    Types: Cigarettes  . Smokeless tobacco: Never Used  Substance Use Topics  . Alcohol use: Yes   Alcohol/week: 60.0 oz    Types: 100 Shots of liquor per week    Comment: Drinks 1 pint of 80 proof whiskey a night (16 oz)  . Drug use: No     Allergies   Patient has no known allergies.   Review of Systems Review of Systems  Constitutional: Negative for fatigue and fever.  Eyes: Negative for redness, itching and visual disturbance.  Respiratory: Negative for shortness of breath.   Cardiovascular: Negative for chest pain and leg swelling.  Gastrointestinal: Negative for nausea and vomiting.  Musculoskeletal: Negative for arthralgias and myalgias.  Skin: Positive for color change. Negative for rash and wound.  Neurological: Negative for dizziness, syncope, weakness, light-headedness and headaches.     Physical Exam Triage Vital Signs ED Triage Vitals  Enc Vitals Group     BP 10/17/17 1720 (!) 143/107     Pulse Rate 10/17/17 1720 90     Resp 10/17/17 1720 18     Temp 10/17/17 1720 98.8 F (37.1 C)     Temp Source 10/17/17 1720 Oral     SpO2 10/17/17 1720 95 %     Weight --      Height --      Head Circumference --      Peak Flow --  Pain Score 10/17/17 1722 6     Pain Loc --      Pain Edu? --      Excl. in GC? --    No data found.  Updated Vital Signs BP (!) 143/107 (BP Location: Right Arm)   Pulse 90   Temp 98.8 F (37.1 C) (Oral)   Resp 18   SpO2 95%   Visual Acuity Right Eye Distance:   Left Eye Distance:   Bilateral Distance:    Right Eye Near:   Left Eye Near:    Bilateral Near:     Physical Exam  Constitutional: He appears well-developed and well-nourished.  HENT:  Head: Normocephalic and atraumatic.  Eyes: Conjunctivae are normal.  Neck: Neck supple.  Cardiovascular: Normal rate.  Pulmonary/Chest: Effort normal and breath sounds normal. No respiratory distress.  Musculoskeletal: He exhibits no edema.  Neurological: He is alert.  Skin: Skin is warm and dry.  Significant erythema and swelling to left foot extending past ankle, extends  to distal third of lower extremity.  Dorsalis pedis 2+, cap refill less than 2 seconds.  Small area on lateral aspect of dorsum of foot from tick bite with increased erythema, full active range of motion of ankle, negative Homans.  No calf tenderness or skin changes over calf  Psychiatric: He has a normal mood and affect.  Nursing note and vitals reviewed.    UC Treatments / Results  Labs (all labs ordered are listed, but only abnormal results are displayed) Labs Reviewed - No data to display  EKG None  Radiology No results found.  Procedures Procedures (including critical care time)  Medications Ordered in UC Medications - No data to display  Initial Impression / Assessment and Plan / UC Course  I have reviewed the triage vital signs and the nursing notes.  Pertinent labs & imaging results that were available during my care of the patient were reviewed by me and considered in my medical decision making (see chart for details).     Patient with cellulitis secondary to tick bite.  Patient also higher risk for having DVT as cause of swelling given smoking history, but given recent tick bite and source of entry for infection will treat for cellulitis.  Will provide doxycycline twice daily for 10 days.  Advised to keep a close eye on swelling and redness, return in 2 to 3 days if not having any improvement with antibiotics.Discussed strict return precautions. Patient verbalized understanding and is agreeable with plan.  Final Clinical Impressions(s) / UC Diagnoses   Final diagnoses:  Cellulitis of left lower extremity  Tick bite, initial encounter     Discharge Instructions     Begin doxycycline- twice daily for 10 days  Ice, elevation, ibuprofen/tylenol  Monitor for improving swelling/redness- measure around foot/ankle  Return in 2-3 days if not having improvement  Return if swelling worsening, developing worsening pain, or not improving   ED Prescriptions     Medication Sig Dispense Auth. Provider   doxycycline (VIBRAMYCIN) 100 MG capsule Take 1 capsule (100 mg total) by mouth 2 (two) times daily for 10 days. 20 capsule Shaundra Fullam C, PA-C     Controlled Substance Prescriptions [place] Controlled Substance Registry consulted? Not Applicable   Lew DawesWieters, Leatha Rohner C, [place]PA-C 10/17/17 1739

## 2017-10-17 NOTE — ED Triage Notes (Signed)
[facility] a [facility] right [facility] pain and swelling secondary to removing a tick from his foot 3 days ago.

## 2017-10-17 NOTE — Discharge Instructions (Signed)
Begin doxycycline- twice daily for 10 days  Ice, elevation, ibuprofen/tylenol  Monitor for improving swelling/redness- measure around foot/ankle  Return in 2-3 days if not having improvement  Return if swelling worsening, developing worsening pain, or not improving

## 2018-11-05 ENCOUNTER — Other Ambulatory Visit: Payer: Self-pay | Admitting: *Deleted

## 2018-11-05 DIAGNOSIS — Z20822 Contact with and (suspected) exposure to covid-19: Secondary | ICD-10-CM

## 2018-11-11 LAB — NOVEL CORONAVIRUS, NAA: SARS-CoV-2, NAA: NOT DETECTED

## 2018-11-12 ENCOUNTER — Telehealth: Payer: Self-pay | Admitting: Hematology

## 2018-11-12 NOTE — Telephone Encounter (Signed)
Negative result was given to pt °

## 2023-12-22 ENCOUNTER — Inpatient Hospital Stay (HOSPITAL_COMMUNITY)
Admission: EM | Admit: 2023-12-22 | Discharge: 2024-01-02 | DRG: 330 | Disposition: A | Discharge status: Home / Self Care | Payer: Self-pay | Attending: Internal Medicine | Admitting: Internal Medicine

## 2023-12-22 ENCOUNTER — Emergency Department (HOSPITAL_COMMUNITY): Payer: Self-pay

## 2023-12-22 ENCOUNTER — Other Ambulatory Visit: Payer: Self-pay

## 2023-12-22 DIAGNOSIS — E8809 Other disorders of plasma-protein metabolism, not elsewhere classified: Secondary | ICD-10-CM | POA: Diagnosis present

## 2023-12-22 DIAGNOSIS — K529 Noninfective gastroenteritis and colitis, unspecified: Secondary | ICD-10-CM | POA: Diagnosis present

## 2023-12-22 DIAGNOSIS — I2510 Atherosclerotic heart disease of native coronary artery without angina pectoris: Secondary | ICD-10-CM | POA: Diagnosis present

## 2023-12-22 DIAGNOSIS — E440 Moderate protein-calorie malnutrition: Secondary | ICD-10-CM | POA: Diagnosis present

## 2023-12-22 DIAGNOSIS — D62 Acute posthemorrhagic anemia: Secondary | ICD-10-CM | POA: Diagnosis not present

## 2023-12-22 DIAGNOSIS — K6389 Other specified diseases of intestine: Principal | ICD-10-CM | POA: Diagnosis present

## 2023-12-22 DIAGNOSIS — K7460 Unspecified cirrhosis of liver: Secondary | ICD-10-CM | POA: Diagnosis present

## 2023-12-22 DIAGNOSIS — E7800 Pure hypercholesterolemia, unspecified: Secondary | ICD-10-CM | POA: Diagnosis present

## 2023-12-22 DIAGNOSIS — I700 Atherosclerosis of aorta: Secondary | ICD-10-CM | POA: Diagnosis present

## 2023-12-22 DIAGNOSIS — F1010 Alcohol abuse, uncomplicated: Secondary | ICD-10-CM | POA: Diagnosis present

## 2023-12-22 DIAGNOSIS — N200 Calculus of kidney: Secondary | ICD-10-CM | POA: Diagnosis present

## 2023-12-22 DIAGNOSIS — K5669 Other partial intestinal obstruction: Secondary | ICD-10-CM | POA: Diagnosis present

## 2023-12-22 DIAGNOSIS — Z79899 Other long term (current) drug therapy: Secondary | ICD-10-CM

## 2023-12-22 DIAGNOSIS — D123 Benign neoplasm of transverse colon: Secondary | ICD-10-CM | POA: Diagnosis present

## 2023-12-22 DIAGNOSIS — D124 Benign neoplasm of descending colon: Secondary | ICD-10-CM | POA: Diagnosis present

## 2023-12-22 DIAGNOSIS — D509 Iron deficiency anemia, unspecified: Secondary | ICD-10-CM

## 2023-12-22 DIAGNOSIS — C189 Malignant neoplasm of colon, unspecified: Secondary | ICD-10-CM

## 2023-12-22 DIAGNOSIS — K922 Gastrointestinal hemorrhage, unspecified: Secondary | ICD-10-CM

## 2023-12-22 DIAGNOSIS — Z6823 Body mass index (BMI) 23.0-23.9, adult: Secondary | ICD-10-CM

## 2023-12-22 DIAGNOSIS — I10 Essential (primary) hypertension: Secondary | ICD-10-CM | POA: Diagnosis present

## 2023-12-22 DIAGNOSIS — K4090 Unilateral inguinal hernia, without obstruction or gangrene, not specified as recurrent: Secondary | ICD-10-CM | POA: Diagnosis present

## 2023-12-22 DIAGNOSIS — D75839 Thrombocytosis, unspecified: Secondary | ICD-10-CM | POA: Diagnosis present

## 2023-12-22 DIAGNOSIS — F17210 Nicotine dependence, cigarettes, uncomplicated: Secondary | ICD-10-CM | POA: Diagnosis present

## 2023-12-22 DIAGNOSIS — Z539 Procedure and treatment not carried out, unspecified reason: Secondary | ICD-10-CM | POA: Diagnosis present

## 2023-12-22 DIAGNOSIS — D649 Anemia, unspecified: Secondary | ICD-10-CM

## 2023-12-22 DIAGNOSIS — Z8659 Personal history of other mental and behavioral disorders: Secondary | ICD-10-CM

## 2023-12-22 DIAGNOSIS — F419 Anxiety disorder, unspecified: Secondary | ICD-10-CM | POA: Diagnosis present

## 2023-12-22 DIAGNOSIS — C182 Malignant neoplasm of ascending colon: Principal | ICD-10-CM | POA: Diagnosis present

## 2023-12-22 DIAGNOSIS — Z8249 Family history of ischemic heart disease and other diseases of the circulatory system: Secondary | ICD-10-CM

## 2023-12-22 DIAGNOSIS — E876 Hypokalemia: Secondary | ICD-10-CM | POA: Diagnosis present

## 2023-12-22 DIAGNOSIS — K648 Other hemorrhoids: Secondary | ICD-10-CM | POA: Diagnosis present

## 2023-12-22 LAB — COMPREHENSIVE METABOLIC PANEL WITH GFR
ALT: 12 U/L (ref 0–44)
AST: 27 U/L (ref 15–41)
Albumin: 2.5 g/dL — ABNORMAL LOW (ref 3.5–5.0)
Alkaline Phosphatase: 91 U/L (ref 38–126)
Anion gap: 11 (ref 5–15)
BUN: 10 mg/dL (ref 6–20)
CO2: 23 mmol/L (ref 22–32)
Calcium: 8.5 mg/dL — ABNORMAL LOW (ref 8.9–10.3)
Chloride: 101 mmol/L (ref 98–111)
Creatinine, Ser: 0.69 mg/dL (ref 0.61–1.24)
GFR, Estimated: >60 mL/min (ref >60)
Glucose, Bld: 98 mg/dL (ref 70–99)
Potassium: 3.2 mmol/L — ABNORMAL LOW (ref 3.5–5.1)
Sodium: 135 mmol/L (ref 135–145)
Total Bilirubin: 0.5 mg/dL (ref 0.0–1.2)
Total Protein: 7.2 g/dL (ref 6.5–8.1)

## 2023-12-22 LAB — PHOSPHORUS: Phosphorus: 2.9 mg/dL (ref 2.5–4.6)

## 2023-12-22 LAB — MAGNESIUM: Magnesium: 1.4 mg/dL — ABNORMAL LOW (ref 1.7–2.4)

## 2023-12-22 LAB — LIPASE, BLOOD: Lipase: 36 U/L (ref 11–51)

## 2023-12-22 LAB — CBC
HCT: 21.3 % — ABNORMAL LOW (ref 39.0–52.0)
Hemoglobin: 6 g/dL — CL (ref 13.0–17.0)
MCH: 21.5 pg — ABNORMAL LOW (ref 26.0–34.0)
MCHC: 28.2 g/dL — ABNORMAL LOW (ref 30.0–36.0)
MCV: 76.3 fL — ABNORMAL LOW (ref 80.0–100.0)
Platelets: 476 K/uL — ABNORMAL HIGH (ref 150–400)
RBC: 2.79 MIL/uL — ABNORMAL LOW (ref 4.22–5.81)
RDW: 18.9 % — ABNORMAL HIGH (ref 11.5–15.5)
WBC: 6.9 K/uL (ref 4.0–10.5)
nRBC: 0 % (ref 0.0–0.2)

## 2023-12-22 LAB — URINALYSIS, ROUTINE W REFLEX MICROSCOPIC
Bilirubin Urine: NEGATIVE
Glucose, UA: NEGATIVE mg/dL
Hgb urine dipstick: NEGATIVE
Ketones, ur: NEGATIVE mg/dL
Leukocytes,Ua: NEGATIVE
Nitrite: NEGATIVE
Protein, ur: NEGATIVE mg/dL
Specific Gravity, Urine: 1.009 (ref 1.005–1.030)
pH: 6 (ref 5.0–8.0)

## 2023-12-22 LAB — FERRITIN: Ferritin: 4 ng/mL — ABNORMAL LOW (ref 24–336)

## 2023-12-22 LAB — FOLATE: Folate: 8.6 ng/mL (ref >5.9)

## 2023-12-22 LAB — RETICULOCYTES
Immature Retic Fract: 31.4 % — ABNORMAL HIGH (ref 2.3–15.9)
RBC.: 2.54 MIL/uL — ABNORMAL LOW (ref 4.22–5.81)
Retic Count, Absolute: 55.6 K/uL (ref 19.0–186.0)
Retic Ct Pct: 2.2 % (ref 0.4–3.1)

## 2023-12-22 LAB — IRON AND TIBC
Iron: 11 ug/dL — ABNORMAL LOW (ref 45–182)
Saturation Ratios: 4 % — ABNORMAL LOW (ref 17.9–39.5)
TIBC: 308 ug/dL (ref 250–450)
UIBC: 297 ug/dL

## 2023-12-22 LAB — POC OCCULT BLOOD, ED: Fecal Occult Bld: POSITIVE — AB

## 2023-12-22 LAB — VITAMIN B12: Vitamin B-12: 1045 pg/mL — ABNORMAL HIGH (ref 180–914)

## 2023-12-22 LAB — ABO/RH: ABO/RH(D): O POS

## 2023-12-22 LAB — PREALBUMIN: Prealbumin: 7 mg/dL — ABNORMAL LOW (ref 18–38)

## 2023-12-22 LAB — PREPARE RBC (CROSSMATCH)

## 2023-12-22 MED ORDER — BISACODYL 10 MG RE SUPP: 10.0000 mg | Freq: Two times a day (BID) | RECTAL | Status: DC | PRN

## 2023-12-22 MED ORDER — ONDANSETRON HCL 4 MG/2ML IJ SOLN: 4.0000 mg | Freq: Four times a day (QID) | INTRAMUSCULAR | Status: DC | PRN

## 2023-12-22 MED ORDER — MORPHINE SULFATE (PF) 2 MG/ML IV SOLN: 2.0000 mg | INTRAVENOUS | Status: DC | PRN

## 2023-12-22 MED ORDER — ONDANSETRON HCL 4 MG/2ML IJ SOLN
4.0000 mg | Freq: Once | INTRAMUSCULAR | Status: AC
  Administered 2023-12-22: 4 mg via INTRAVENOUS
  Filled 2023-12-22: qty 2

## 2023-12-22 MED ORDER — POLYETHYLENE GLYCOL 3350 17 G PO PACK: 17.0000 g | PACK | Freq: Every day | ORAL | Status: DC | PRN

## 2023-12-22 MED ORDER — ACETAMINOPHEN 325 MG PO TABS: 650.0000 mg | ORAL_TABLET | Freq: Four times a day (QID) | ORAL | Status: DC | PRN

## 2023-12-22 MED ORDER — CALCIUM POLYCARBOPHIL 625 MG PO TABS
625.0000 mg | ORAL_TABLET | Freq: Two times a day (BID) | ORAL | Status: DC
  Administered 2023-12-23 – 2023-12-25 (×6): 625 mg via ORAL
  Filled 2023-12-22 (×8): qty 1

## 2023-12-22 MED ORDER — TAB-A-VITE/IRON PO TABS
1.0000 | ORAL_TABLET | Freq: Every day | ORAL | Status: DC
  Administered 2023-12-23 – 2024-01-02 (×12): 1 via ORAL
  Filled 2023-12-22 (×12): qty 1

## 2023-12-22 MED ORDER — SODIUM CHLORIDE 0.9% IV SOLUTION: Freq: Once | INTRAVENOUS | Status: DC

## 2023-12-22 MED ORDER — POLYETHYLENE GLYCOL 3350 17 G PO PACK
17.0000 g | PACK | Freq: Every day | ORAL | Status: DC
  Administered 2023-12-22: 17 g via ORAL
  Filled 2023-12-22 (×2): qty 1

## 2023-12-22 MED ORDER — ALUM & MAG HYDROXIDE-SIMETH 200-200-20 MG/5ML PO SUSP
30.0000 mL | Freq: Four times a day (QID) | ORAL | Status: DC | PRN
  Administered 2023-12-28 – 2023-12-29 (×4): 30 mL via ORAL
  Filled 2023-12-22 (×4): qty 30

## 2023-12-22 MED ORDER — MAGNESIUM SULFATE 2 GM/50ML IV SOLN
2.0000 g | Freq: Once | INTRAVENOUS | Status: AC
  Administered 2023-12-22: 2 g via INTRAVENOUS
  Filled 2023-12-22: qty 50

## 2023-12-22 MED ORDER — ONDANSETRON 4 MG PO TBDP: 4.0000 mg | ORAL_TABLET | Freq: Once | ORAL | Status: DC | PRN

## 2023-12-22 MED ORDER — MORPHINE SULFATE (PF) 4 MG/ML IV SOLN
4.0000 mg | Freq: Once | INTRAVENOUS | Status: AC
  Administered 2023-12-22: 4 mg via INTRAVENOUS
  Filled 2023-12-22: qty 1

## 2023-12-22 MED ORDER — SIMETHICONE 40 MG/0.6ML PO SUSP: 80.0000 mg | Freq: Four times a day (QID) | ORAL | Status: DC | PRN

## 2023-12-22 MED ORDER — LORAZEPAM 2 MG/ML IJ SOLN: 1.0000 mg | INTRAMUSCULAR | Status: DC | PRN

## 2023-12-22 MED ORDER — FOLIC ACID 1 MG PO TABS
1.0000 mg | ORAL_TABLET | Freq: Every day | ORAL | Status: DC
  Administered 2023-12-22 – 2024-01-02 (×13): 1 mg via ORAL
  Filled 2023-12-22 (×11): qty 1

## 2023-12-22 MED ORDER — LORAZEPAM 1 MG PO TABS: 1.0000 mg | ORAL_TABLET | ORAL | Status: AC | PRN

## 2023-12-22 MED ORDER — LACTATED RINGERS IV SOLN: INTRAVENOUS | Status: AC

## 2023-12-22 MED ORDER — HYDRALAZINE HCL 20 MG/ML IJ SOLN: 5.0000 mg | INTRAMUSCULAR | Status: DC | PRN

## 2023-12-22 MED ORDER — THIAMINE MONONITRATE 100 MG PO TABS
100.0000 mg | ORAL_TABLET | Freq: Every day | ORAL | Status: DC
  Administered 2023-12-22 – 2024-01-02 (×11): 100 mg via ORAL
  Filled 2023-12-22 (×11): qty 1

## 2023-12-22 MED ORDER — LACTATED RINGERS IV BOLUS: 1000.0000 mL | Freq: Three times a day (TID) | INTRAVENOUS | Status: AC | PRN

## 2023-12-22 MED ORDER — LACTATED RINGERS IV BOLUS
1000.0000 mL | Freq: Once | INTRAVENOUS | Status: AC
  Administered 2023-12-22: 1000 mL via INTRAVENOUS

## 2023-12-22 MED ORDER — PHENOL 1.4 % MT LIQD: 2.0000 | OROMUCOSAL | Status: DC | PRN

## 2023-12-22 MED ORDER — IOHEXOL 300 MG/ML  SOLN
100.0000 mL | Freq: Once | INTRAMUSCULAR | Status: AC | PRN
  Administered 2023-12-22: 100 mL via INTRAVENOUS

## 2023-12-22 MED ORDER — OXYCODONE HCL 5 MG PO TABS
5.0000 mg | ORAL_TABLET | Freq: Four times a day (QID) | ORAL | Status: DC | PRN
  Administered 2023-12-26 (×2): 5 mg via ORAL
  Filled 2023-12-22: qty 1

## 2023-12-22 MED ORDER — DIAZEPAM 5 MG/ML IJ SOLN: 2.5000 mg | INTRAMUSCULAR | Status: AC | PRN

## 2023-12-22 MED ORDER — LORAZEPAM 1 MG PO TABS: 1.0000 mg | ORAL_TABLET | ORAL | Status: DC | PRN

## 2023-12-22 MED ORDER — THIAMINE HCL 100 MG/ML IJ SOLN
100.0000 mg | Freq: Every day | INTRAMUSCULAR | Status: DC
  Administered 2023-12-25 (×2): 100 mg via INTRAVENOUS
  Filled 2023-12-22 (×2): qty 2

## 2023-12-22 MED ORDER — POTASSIUM CHLORIDE CRYS ER 20 MEQ PO TBCR
40.0000 meq | EXTENDED_RELEASE_TABLET | Freq: Every day | ORAL | Status: AC
  Administered 2023-12-22 – 2023-12-24 (×3): 40 meq via ORAL
  Filled 2023-12-22 (×3): qty 2

## 2023-12-22 MED ORDER — MENTHOL 3 MG MT LOZG: 1.0000 | LOZENGE | OROMUCOSAL | Status: DC | PRN

## 2023-12-22 MED ORDER — METOPROLOL TARTRATE 5 MG/5ML IV SOLN: 5.0000 mg | Freq: Four times a day (QID) | INTRAVENOUS | Status: DC | PRN

## 2023-12-22 MED ORDER — MAGIC MOUTHWASH: 15.0000 mL | Freq: Four times a day (QID) | ORAL | Status: DC | PRN

## 2023-12-22 MED ORDER — MELATONIN 5 MG PO TABS
5.0000 mg | ORAL_TABLET | Freq: Every evening | ORAL | Status: DC | PRN
  Administered 2023-12-23 – 2024-01-01 (×14): 5 mg via ORAL
  Filled 2023-12-22 (×10): qty 1

## 2023-12-22 MED ORDER — IPRATROPIUM-ALBUTEROL 0.5-2.5 (3) MG/3ML IN SOLN: 3.0000 mL | RESPIRATORY_TRACT | Status: DC | PRN

## 2023-12-22 NOTE — ED Provider Notes (Signed)
 [place] EMERGENCY DEPARTMENT AT [facility] Provider Note   CSN: [id number] Arrival date & time: 12/22/23  1416     Patient presents with: Abdominal Pain   Rodney [name] is a 63 y.o. male.    Abdominal Pain    Patient has history of kidney stones.  Patient states he has been having trouble with lower abdominal pain ongoing for about the last month or so.  Symptoms have been increasing however over the last week.  Patient states the pain seems to get worse throughout the day as he is working.  He has been having a few episodes, approximately 3/day of loose stool/diarrhea associated with the symptoms.  He denies any fevers.  No dysuria.  Tylenol  seems to make it a little bit better Pt does drink alcohol regular. Prior to Admission medications   Not on File    Allergies: Patient has no known allergies.    Review of Systems  Gastrointestinal:  Positive for abdominal pain.    Updated Vital Signs BP (!) 152/103   Pulse 78   Temp 99.2 F (37.3 C) (Oral)   Resp 15   SpO2 99%   Physical Exam Vitals and nursing note reviewed.  Constitutional:      General: He is not in acute distress.    Appearance: He is well-developed.  HENT:     Head: Normocephalic and atraumatic.     Right Ear: External ear normal.     Left Ear: External ear normal.  Eyes:     General: No scleral icterus.       Right eye: No discharge.        Left eye: No discharge.     Conjunctiva/sclera: Conjunctivae normal.  Neck:     Trachea: No tracheal deviation.  Cardiovascular:     Rate and Rhythm: Normal rate and regular rhythm.  Pulmonary:     Effort: Pulmonary effort is normal. No respiratory distress.     Breath sounds: Normal breath sounds. No stridor. No wheezing or rales.  Abdominal:     General: Bowel sounds are normal. There is no distension.     Palpations: Abdomen is soft.     Tenderness: There is abdominal tenderness in the right lower quadrant and left lower quadrant. There is  no guarding or rebound.     Hernia: No hernia is present.  Musculoskeletal:        General: No tenderness or deformity.     Cervical back: Neck supple.  Skin:    General: Skin is warm and dry.     Findings: No rash.  Neurological:     General: No focal deficit present.     Mental Status: He is alert.     Cranial Nerves: No cranial nerve deficit, dysarthria or facial asymmetry.     Sensory: No sensory deficit.     Motor: No abnormal muscle tone or seizure activity.     Coordination: Coordination normal.  Psychiatric:        Mood and Affect: Mood normal.     (all labs ordered are listed, but only abnormal results are displayed) Labs Reviewed  COMPREHENSIVE METABOLIC PANEL WITH GFR - Abnormal; Notable for the following components:      Result Value   Potassium 3.2 (*)    Calcium  8.5 (*)    Albumin 2.5 (*)    All other components within normal limits  CBC - Abnormal; Notable for the following components:   RBC 2.79 (*)  Hemoglobin 6.0 (*)    HCT 21.3 (*)    MCV 76.3 (*)    MCH 21.5 (*)    MCHC 28.2 (*)    RDW 18.9 (*)    Platelets 476 (*)    All other components within normal limits  URINALYSIS, ROUTINE W REFLEX MICROSCOPIC - Abnormal; Notable for the following components:   Color, Urine STRAW (*)    All other components within normal limits  POC OCCULT BLOOD, ED - Abnormal; Notable for the following components:   Fecal Occult Bld POSITIVE (*)    All other components within normal limits  LIPASE, BLOOD  VITAMIN B12  FOLATE  IRON  AND TIBC  FERRITIN  RETICULOCYTES  CEA  MAGNESIUM   PHOSPHORUS  PREALBUMIN  TYPE AND SCREEN  PREPARE RBC (CROSSMATCH)  ABO/RH    EKG: None  Radiology: CT ABDOMEN PELVIS W CONTRAST Result Date: 12/22/2023 CLINICAL DATA:  Left lower quadrant pain and anemia. * Tracking Code: BO * EXAM: CT ABDOMEN AND PELVIS WITH CONTRAST TECHNIQUE: Multidetector CT imaging of the abdomen and pelvis was performed using the standard protocol following  bolus administration of intravenous contrast. RADIATION DOSE REDUCTION: This exam was performed according to the departmental dose-optimization program which includes automated exposure control, adjustment of the mA and/or kV according to patient size and/or use of iterative reconstruction technique. CONTRAST:  OMNIPAQUE  IOHEXOL  300 MG/ML  SOLN COMPARISON:  01/14/2005 FINDINGS: Lower chest: Lingular scarring. Normal heart size without pericardial or pleural effusion. Three vessel coronary artery calcification. Hepatobiliary: Mild caudate lobe enlargement. No suspicious liver lesion. Borderline gallbladder distension without calcified stone or pericholecystic edema. No biliary duct dilatation. Pancreas: Normal, without mass or ductal dilatation. Spleen: Normal in size, without focal abnormality. Adrenals/Urinary Tract: Normal adrenal glands. Bilateral extrarenal pelvis. Although there is no significant upstream dilatation, dominant stone in the left renal pelvis measures 2.1 by 3.8 cm including on 32/3. Normal urinary bladder. Stomach/Bowel: Normal stomach, without wall thickening. Involving the majority of the ascending colon is a large mass including at up to 9.8 x 8.6 x 10.3 cm. Normal terminal ileum. A diminutive appendix is likely identified on 59/2. No bowel obstruction. Vascular/Lymphatic: Aortic atherosclerosis. No abdominopelvic adenopathy. Reproductive: Normal prostate. Other: Tiny fat containing right inguinal hernia. No significant free fluid. No free intraperitoneal air. No evidence of omental or peritoneal disease. Musculoskeletal: No acute osseous abnormality. IMPRESSION: 1. Large (10.3 cm maximally) mass centered about the ascending colon. 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 Given large size and lack of bowel obstruction, less likely differential considerations including lymphoma are possible. 2. No evidence of metastatic disease 3. Dominant 3.8 cm stone within a left extrarenal pelvis without  significant obstruction. 4. Coronary artery atherosclerosis. Aortic Atherosclerosis (ICD10-I70.0). 5. Mild caudate lobe enlargement is new since 2006. Given the EMR history of 100 standard drinks of alcohol per week early cirrhosis is a concern. Report called to Dr. Lynwood at 5:55 p.m. Electronically Signed   By: Rockey Kilts M.D.   On: 12/22/2023 18:26   DG Chest 2 View Result Date: 12/22/2023 CLINICAL DATA:  Lower abdominal pain for the past week. Some nausea. Smoker. EXAM: CHEST - 2 VIEW COMPARISON:  04/17/2012 FINDINGS: Normal-sized heart. Clear lungs with normal vascularity. The lungs remain hyperexpanded with flattening of the hemidiaphragms on the lateral view. Stable mild central peribronchial thickening. Mild thoracic spine degenerative changes. IMPRESSION: 1. No acute abnormality. 2. Stable changes of COPD and chronic bronchitis. Electronically Signed   By: Elspeth Bathe M.D.   On: 12/22/2023 17:02     .  Critical Care  Performed by: Randol Simmonds, MD Authorized by: Randol Simmonds, MD   Critical care provider statement:    Critical care time (minutes):  35   Critical care was time spent personally by me on the following activities:  Development of treatment plan with patient or surrogate, discussions with consultants, evaluation of patient's response to treatment, examination of patient, ordering and review of laboratory studies, ordering and review of radiographic studies, ordering and performing treatments and interventions, pulse oximetry, re-evaluation of patient's condition and review of old charts    Medications Ordered in the ED  0.9 %  sodium chloride  infusion (Manually program via Guardrails IV Fluids) (has no administration in time range)  lactated ringers  bolus 1,000 mL (has no administration in time range)  lactated ringers  bolus 1,000 mL (has no administration in time range)  multivitamins with iron  tablet 1 tablet (has no administration in time range)  phenol (CHLORASEPTIC) mouth  spray 2 spray (has no administration in time range)  menthol -cetylpyridinium (CEPACOL) lozenge 3 mg (has no administration in time range)  magic mouthwash (has no administration in time range)  alum & mag hydroxide-simeth (MAALOX/MYLANTA) 200-200-20 MG/5ML suspension 30 mL (has no administration in time range)  simethicone  (MYLICON) 40 MG/0.6ML suspension 80 mg (has no administration in time range)  polycarbophil (FIBERCON) tablet 625 mg (has no administration in time range)  polyethylene glycol (MIRALAX  / GLYCOLAX ) packet 17 g (has no administration in time range)  bisacodyl  (DULCOLAX) suppository 10 mg (has no administration in time range)  ondansetron  (ZOFRAN ) injection 4 mg (has no administration in time range)  potassium chloride  SA (KLOR-CON  M) CR tablet 40 mEq (has no administration in time range)  lactated ringers  bolus 1,000 mL (1,000 mLs Intravenous New Bag/Given 12/22/23 1651)  ondansetron  (ZOFRAN ) injection 4 mg (4 mg Intravenous Given 12/22/23 1650)  morphine  (PF) 4 MG/ML injection 4 mg (4 mg Intravenous Given 12/22/23 1651)  iohexol  (OMNIPAQUE ) 300 MG/ML solution 100 mL (100 mLs Intravenous Contrast Given 12/22/23 1801)    Clinical Course as of 12/22/23 1846  Fri Dec 22, 2023  1650 Comprehensive metabolic panel(!) Metabolic panel shows potassium decreased.  CBC shows no leukocytosis but patient has a hemoglobin decreased at 6.0.  He has low MCV [JK]  1736 Patient's Hemoccult is positive.  Findings are consistent with GI bleeding.  Will transfuse 1 unit of blood [JK]  1824 CT scan shows large mass, concerning for malignancy [JK]  1840 Reviewed with Dr Sheldon, general surgery.  Would recommend GI consultation.  Pt will need biopsy to help guide if any surgical indication is indicated [JK]  1845 Secure chat consult request to Dr Burnette [facility] [JK]    Clinical Course User Index [JK] Randol Simmonds, MD                                 Medical Decision Making Differential diagnosis  includes but not limited to diverticulitis colitis, appendicitis  Problems Addressed: Anemia, unspecified type: acute illness or injury that poses a threat to life or bodily functions Colonic mass: acute illness or injury that poses a threat to life or bodily functions Lower GI bleed: acute illness or injury that poses a threat to life or bodily functions  Amount and/or Complexity of Data Reviewed Labs: ordered. Decision-making details documented in ED Course. Radiology: ordered.  Risk Prescription drug management. Decision regarding hospitalization.   Patient presented to ED with complaints of lower  abdominal pain.  Patient also has noticed nausea.  Patient's initial labs were notable for significant anemia.  Low MCV suggests more chronic nature to his anemia.  Patient has not noticed any blood in his stool however he is Hemoccult positive.  CT scan was ordered and it shows a large mass adjacent to the ascending colon suggestive of an adenocarcinoma.  Patient most likely has colon cancer resulting in his lower GI bleeding and anemia.  Hemoglobin is less than 7.  Have ordered blood transfusion.  I consulted with general surgery.  Patient will need colonoscopy for tissue sampling.  I have sent a secure chat consult to [facility] Dr. Burnette.  I will consult the medical service for admission.  Case discussed with Dr Shona     Final diagnoses:  Colonic mass  Lower GI bleed  Anemia, unspecified type    ED Discharge Orders     None          Randol Simmonds, MD 12/22/23 [phone]

## 2023-12-22 NOTE — ED Notes (Signed)
 Pt said he is unable to give urine sample at this time

## 2023-12-22 NOTE — H&P (Addendum)
 History and Physical  PACER DORN FMW:[id number] DOB: Feb 23, 1961 DOA: 12/22/2023  Referring physician: Dr. Randol, EDP. PCP: Rollene Almarie LABOR, MD  Outpatient Specialists: None. Patient coming from: Home.  Chief Complaint: Abdominal pain, shortness of breath.  HPI: Rodney [name] is a 63 y.o. male with medical history significant for alcohol abuse, drinks 1 pint of whiskey daily, last alcoholic drink was last night, current tobacco abuse, smokes about a pack per day for the past 50 years, who presents to the ER with complaints of lower abdominal cramping for the past 4 weeks and worse today.  Associated with loose stools.  Also endorses progressive dyspnea worse on exertion.  Associated with a persistent nonproductive cough.  Due to worsening symptoms he presented to the ER for further evaluation.  In the ER, tachycardic and tachypneic.  Severely anemic with hemoglobin of 6.0 and MCV of 76.3.  Positive FOBT.  Severe iron  deficiency anemia.  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  Lymphoma is on the differentials.  No evidence of metastatic disease.  Dominant 3.8 cm stone within the left extra renal pelvis without significant obstruction.  Coronary artery atherosclerosis.  Aortic atherosclerosis.  Mild caudate lobe enlargement is new since 2006.  EDP discussed the case with general surgery, Dr. Sheldon who recommended admission for possible biopsy.  GI was also consulted by EDP.  TRH, hospitalist service, was asked to admit.  ED Course: Temperature 99.  BP 133/86, pulse 79, respiratory rate 18, O2 saturation 100% on room air.  Lab studies notable for hemoglobin of 6.0, MCV 76.3, platelet 476, ferritin 4, trans sat 4.  Serum magnesium  1.4, serum potassium 3.2.  Review of Systems: Review of systems as noted in the HPI. All other systems reviewed and are negative.   Past  Medical History:  Diagnosis Date   Anxiety state, unspecified    Calculus of kidney    Lumbago    Personal history of other diseases of digestive system    No past surgical history on file.  Social History:  reports that he has been smoking cigarettes. He has never used smokeless tobacco. He reports current alcohol use of about 100.0 standard drinks of alcohol per week. He reports that he does not use drugs.   No Known Allergies  Family History  Problem Relation Age of Onset   Cancer Neg Hx    Heart disease Neg Hx    Hyperlipidemia Neg Hx    Hypertension Neg Hx    Kidney disease Neg Hx    Stroke Neg Hx   Father with history of heart attack in his 63s.   Home medications: None reported.   Physical Exam: BP 133/86   Pulse 79   Temp 98.9 F (37.2 C) (Oral)   Resp 18   SpO2 100%   General: 63 y.o. year-old male well developed well nourished in no acute distress.  Alert and oriented x3. Cardiovascular: Regular rate and rhythm with no rubs or gallops.  No thyromegaly or JVD noted.  No lower extremity edema. 2/4 pulses in all 4 extremities. Respiratory: Clear to auscultation with no wheezes or rales. Good inspiratory effort. Abdomen: Soft tender lower quadrants nondistended with normal bowel sounds x4 quadrants. Muskuloskeletal: No cyanosis, clubbing or edema noted bilaterally Neuro: CN II-XII intact, strength, sensation, reflexes Skin: No ulcerative lesions noted or rashes Psychiatry: Judgement and insight appear normal. Mood is appropriate for  condition and setting          Labs on Admission:  Basic Metabolic Panel: Recent Labs  Lab 12/22/23 1514 12/22/23 1846  NA 135  --   K 3.2*  --   CL 101  --   CO2 23  --   GLUCOSE 98  --   BUN 10  --   CREATININE 0.69  --   CALCIUM  8.5*  --   MG  --  1.4*  PHOS  --  2.9   Liver Function Tests: Recent Labs  Lab 12/22/23 1514  AST 27  ALT 12  ALKPHOS 91  BILITOT 0.5  PROT 7.2  ALBUMIN 2.5*   Recent Labs  Lab  12/22/23 1514  LIPASE 36   No results for input(s): AMMONIA in the last 168 hours. CBC: Recent Labs  Lab 12/22/23 1514  WBC 6.9  HGB 6.0*  HCT 21.3*  MCV 76.3*  PLT 476*   Cardiac Enzymes: No results for input(s): CKTOTAL, CKMB, CKMBINDEX, TROPONINI in the last 168 hours.  BNP (last 3 results) No results for input(s): BNP in the last 8760 hours.  ProBNP (last 3 results) No results for input(s): PROBNP in the last 8760 hours.  CBG: No results for input(s): GLUCAP in the last 168 hours.  Radiological Exams on Admission: CT ABDOMEN PELVIS W CONTRAST Result Date: 12/22/2023 CLINICAL DATA:  Left lower quadrant pain and anemia. * Tracking Code: BO * EXAM: CT ABDOMEN AND PELVIS WITH CONTRAST TECHNIQUE: Multidetector CT imaging of the abdomen and pelvis was performed using the standard protocol following bolus administration of intravenous contrast. RADIATION DOSE REDUCTION: This exam was performed according to the departmental dose-optimization program which includes automated exposure control, adjustment of the mA and/or kV according to patient size and/or use of iterative reconstruction technique. CONTRAST:  OMNIPAQUE  IOHEXOL  300 MG/ML  SOLN COMPARISON:  01/14/2005 FINDINGS: Lower chest: Lingular scarring. Normal heart size without pericardial or pleural effusion. Three vessel coronary artery calcification. Hepatobiliary: Mild caudate lobe enlargement. No suspicious liver lesion. Borderline gallbladder distension without calcified stone or pericholecystic edema. No biliary duct dilatation. Pancreas: Normal, without mass or ductal dilatation. Spleen: Normal in size, without focal abnormality. Adrenals/Urinary Tract: Normal adrenal glands. Bilateral extrarenal pelvis. Although there is no significant upstream dilatation, dominant stone in the left renal pelvis measures 2.1 by 3.8 cm including on 32/3. Normal urinary bladder. Stomach/Bowel: Normal stomach, without wall  thickening. Involving the majority of the ascending colon is a large mass including at up to 9.8 x 8.6 x 10.3 cm. Normal terminal ileum. A diminutive appendix is likely identified on 59/2. No bowel obstruction. Vascular/Lymphatic: Aortic atherosclerosis. No abdominopelvic adenopathy. Reproductive: Normal prostate. Other: Tiny fat containing right inguinal hernia. No significant free fluid. No free intraperitoneal air. No evidence of omental or peritoneal disease. Musculoskeletal: No acute osseous abnormality. IMPRESSION: 1. Large (10.3 cm maximally) mass centered about the ascending colon. 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 Given large size and lack of bowel obstruction, less likely differential considerations including lymphoma are possible. 2. No evidence of metastatic disease 3. Dominant 3.8 cm stone within a left extrarenal pelvis without significant obstruction. 4. Coronary artery atherosclerosis. Aortic Atherosclerosis (ICD10-I70.0). 5. Mild caudate lobe enlargement is new since 2006. Given the EMR history of 100 standard drinks of alcohol per week early cirrhosis is a concern. Report called to Dr. Lynwood at 5:55 p.m. Electronically Signed   By: Rockey Kilts M.D.   On: 12/22/2023 18:26   DG Chest 2 View Result Date:  12/22/2023 CLINICAL DATA:  Lower abdominal pain for the past week. Some nausea. Smoker. EXAM: CHEST - 2 VIEW COMPARISON:  04/17/2012 FINDINGS: Normal-sized heart. Clear lungs with normal vascularity. The lungs remain hyperexpanded with flattening of the hemidiaphragms on the lateral view. Stable mild central peribronchial thickening. Mild thoracic spine degenerative changes. IMPRESSION: 1. No acute abnormality. 2. Stable changes of COPD and chronic bronchitis. Electronically Signed   By: Elspeth Bathe M.D.   On: 12/22/2023 17:02    EKG: I independently viewed the EKG done and my findings are as followed: Sinus rhythm rate of 76.  Nonspecific ST-T changes QTc 422.  Assessment/Plan Present on  Admission:  Alcohol abuse  Essential hypertension, benign  Anxiety  Colonic mass  Principal Problem:   Colonic mass Active Problems:   Mass of hepatic flexure of colon   Anxiety   Alcohol abuse   Essential hypertension, benign   IDA (iron  deficiency anemia)   History of panic attacks   Inguinal hernia, right   Hypokalemia   Hypoalbuminemia   Abdominal pain secondary to large colonic mass Differential adenocarcinoma, lymphoma General Surgery and GI consulted by EDP Continue supportive care Gentle IV fluid hydration LR 50 cc/h x 2 days.  Symptomatic anemia Chronic blood loss anemia in the setting of large colonic mass Hemoglobin 6.0 with positive FOBT 1 unit PRBC ordered to be transfused Repeat CBC in the morning  Severe iron  deficiency anemia in the setting of chronic blood loss likely from large colonic mass Consider iron  supplement once cleared by GI.  Alcohol abuse with concern for alcohol withdrawal 1 pinto whiskey per day Alcohol abuse cessation counseling done at bedside, the patient is not interested in quitting. CIWA protocol in place  Tobacco abuse About 1 pack/day x 50 years Tobacco cessation counseling done at bedside, the patient is not interested in quitting.  Possible cirrhosis, seen on CT scan Recommend complete cessation of alcohol use Avoid hepatotoxic agents.  Coronary artery atherosclerosis.  Aortic atherosclerosis. Denies any anginal symptoms. Continue to monitor on telemetry  Dominant 3.8 cm stone within the left extra renal pelvis without significant obstruction, incidental finding.  Currently asymptomatic Continue IV fluid hydration  Electrolyte disturbances Hypomagnesemia Hypokalemia Repleted intravenously and orally Phosphate level was 2.9, at risk for electrolytes imbalance in the setting of alcoholism. Repeat labs in the morning   Critical care time: 65 minutes.   DVT prophylaxis: SCDs  Code Status: Full code  Family  Communication: Wife at bedside.  Disposition Plan: Admitted to telemetry unit  Consults called: GI, general surgery.  Admission status: Inpatient status.   Status is: Inpatient The patient requires at least 2 midnights for further evaluation and treatment of present condition.   Terry LOISE Hurst MD [facility] Pager [phone]  If 7PM-7AM, please contact night-coverage [email or web address] Password [facility]  12/22/2023, 8:43 PM

## 2023-12-22 NOTE — Consult Note (Signed)
 Rodney [name]  1961-05-11 [id number]  CARE TEAM:  PCP: Rollene Almarie LABOR, MD  Outpatient Care Team: Patient Care Team: Rollene Almarie LABOR, MD as PCP - General (Internal Medicine)  Inpatient Treatment Team: Treatment Team:  Rodney [name] Simmonds, MD Rodney [name], Rodney [name], NT Rodney Mayme RAMAN, RN [facility], Md, MD Rodney Hercules, MD Rodney Fallow, MD   This patient is a 63 y.o.male who presents today for surgical evaluation at the request of Dr Rodney [name] , [facility] ED  Chief complaint / Reason for evaluation: Abd pain with mass   63 year old male.  History of hypertension, hypercholesterolemia, anxiety, panic attacks, tobacco abuse, alcohol abuse.  Has been followed through [facility] intermittently but I do not think he has seen anyone in the past decade.  Has had worsening crampy abdominal pain.  More concerning today.  Came to emergency department.  Workup showed hemoglobin of 6 with profound anemia, hypokalemia, low albumin, and CAT scan showing bulky mass in right abdomen most likely at the hepatic flexure/transverse colon suspicious for colon malignancy.  Medical admission and surgical consultation requested.  Concern that there is some liver changes that could point to some cirrhosis as well.   Assessment  Rodney [name]  63 y.o. 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  Plan:  Hospitalization.  Agree with transfusion.  Correct hypokalemia.  Check magnesium  and phosphorus correct as well.  Albumin of 2.5 raises concern of malnutrition.  Check prealbumin.  I wonder if he has had significant unintentional weight loss.  Gastroenterology  consultation for bowel prep and biopsying to see if this truly is malignancy as suspected.  Adenocarcinoma, lymphoma, etc.  If adenocarcinoma as suspected, completion of metastatic workup with CEA level and CT chest with IV contrast   It looks like his hypertension is not under control despite his significant anemia and he is on no medications right now.  Need to make sure that that is under control.  Do not know his operative risks at this point.  Consider echocardiogram but defer to medicine.  There is concern he has a history of alcohol abuse but 2010 records note he has had some success in absence/remission.  Do not know that status.  Defer to medicine on need for CIWA protocol etc.  At some point, patient could benefit from colon resection.  Most likely proximal right colectomy.  It is pushing near the IVC in the second part of the duodenal sweep which raises concerns.  Surgical intervention at this time would make the likelihood of need for temporary ostomy much higher.  On the other hand, he has abdominal crampiness which raises the concern of partial obstruction.  No major concern of that on CAT scan today.  Also, there are a lot of issues that need to be addressed first including his severe anemia, uncontrolled hypertension, malnutrition, electrolyte abnormalities.    Surgery will help follow once we get a sense of operative risks, health improvement, plans for colonoscopy and biopsy.    I reviewed nursing notes, ED provider notes, last 24 h vitals and pain scores, last 48 h intake and output, last  24 h labs and trends, and last 24 h imaging results. I have reviewed this patient's available data, including medical history, events of note, test results, etc as part of my evaluation.  A significant portion of that time was spent in counseling.  Care during the described time interval was provided by me.  This care required moderate level of medical decision making.  12/22/2023  Elspeth KYM Schultze, MD, FACS, MASCRS Esophageal, Gastrointestinal & Colorectal Surgery Robotic and Minimally Invasive Surgery  [facility] [address], [address] [place], [place] [address] [phone] Fax [phone] Main  CONTACT INFORMATION: Weekday (9AM-5PM): Call [facility] main office at [phone] Weeknight (5PM-9AM) or Weekend/Holiday: Check EPIC Web Links tab & use AMION (password  [email or web address]) for General Surgery [facility] coverage  Please, DO NOT use SecureChat  (it is not reliable communication to reach operating surgeons & will lead to a delay in care).   Epic staff messaging available for outptient concerns needing 1-2 business day response.      12/22/2023      Past Medical History:  Diagnosis Date   Anxiety state, unspecified    Calculus of kidney    Lumbago    Personal history of other diseases of digestive system     No past surgical history on file.  Social History   Socioeconomic History   Marital status: Married    Spouse name: Not on file   Number of children: 0   Years of education: 12   Highest education level: Not on file  Occupational History   Occupation: Architect: [employer]  Tobacco Use   Smoking status: Every Day    Current packs/day: 0.50    Types: Cigarettes   Smokeless tobacco: Never  Substance and Sexual Activity   Alcohol use: Yes    Alcohol/week: 100.0 standard drinks of alcohol    Types: 100 Shots of liquor per week    Comment: Drinks 1 pint of 80 proof whiskey a night (16 oz)   Drug use: No   Sexual activity: Not on file  Other Topics Concern   Not on file  Social History Narrative   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 [name] ([employer]), has been there for 26 years ('13). Wife married in '07, marriage in good health. SO: torticollis, operation '10 to remove 3 discs   Social Drivers of Corporate investment banker Strain:  Not on file  Food Insecurity: Not on file  Transportation Needs: Not on file  Physical Activity: Not on file  Stress: Not on file  Social Connections: Not on file  Intimate Partner Violence: Not on file    Family History  Problem Relation Age of Onset   Cancer Neg Hx    Heart disease Neg Hx    Hyperlipidemia Neg Hx    Hypertension Neg Hx    Kidney disease Neg Hx    Stroke Neg Hx     Current Facility-Administered Medications  Medication Dose Route Frequency Provider Last Rate Last Admin   0.9 %  sodium chloride  infusion (Manually program via Guardrails IV Fluids)   Intravenous Once Rodney [name] Simmonds, MD       alum & mag hydroxide-simeth (MAALOX/MYLANTA) 200-200-20 MG/5ML suspension 30 mL  30 mL Oral Q6H PRN Schultze Elspeth, MD       bisacodyl  (DULCOLAX) suppository 10 mg  10 mg Rectal Q12H PRN Schultze Elspeth, MD  hydrALAZINE  (APRESOLINE ) injection 5-10 mg  5-10 mg Intravenous Q4H PRN Sheldon Standing, MD       lactated ringers  bolus 1,000 mL  1,000 mL Intravenous Once Sheldon Standing, MD       lactated ringers  bolus 1,000 mL  1,000 mL Intravenous Q8H PRN Sheldon Standing, MD       magic mouthwash  15 mL Oral QID PRN Sheldon Standing, MD       menthol -cetylpyridinium (CEPACOL) lozenge 3 mg  1 lozenge Oral PRN Sheldon Standing, MD       metoprolol  tartrate (LOPRESSOR ) injection 5 mg  5 mg Intravenous Q6H PRN Sheldon Standing, MD       multivitamins with iron  tablet 1 tablet  1 tablet Oral Daily Sheldon Standing, MD       ondansetron  (ZOFRAN ) injection 4 mg  4 mg Intravenous Q6H PRN Sheldon Standing, MD       phenol (CHLORASEPTIC) mouth spray 2 spray  2 spray Mouth/Throat PRN Sheldon Standing, MD       polycarbophil (FIBERCON) tablet 625 mg  625 mg Oral BID Sheldon Standing, MD       polyethylene glycol (MIRALAX  / GLYCOLAX ) packet 17 [name]  17 [name] Oral Daily Sheldon Standing, MD       potassium chloride  SA (KLOR-CON  M) CR tablet 40 mEq  40 mEq Oral Daily Sheldon Standing, MD       simethicone  (MYLICON) 40 MG/0.6ML suspension 80  mg  80 mg Oral QID PRN Sheldon Standing, MD       No current outpatient medications on file.     No Known Allergies   BP (!) 152/103   Pulse 78   Temp 99.2 F (37.3 C) (Oral)   Resp 15   SpO2 99%     Results:   Labs: Results for orders placed or performed during the hospital encounter of 12/22/23 (from the past 48 hours)  ABO/Rh     Status: None   Collection Time: 12/22/23  2:47 PM  Result Value Ref Range   ABO/RH(D)      O POS Performed at [facility], [address] [place], [place] [address]   Lipase, blood     Status: None   Collection Time: 12/22/23  3:14 PM  Result Value Ref Range   Lipase 36 11 - 51 U/L    Comment: Performed at [facility], [address] [place], [place] [address]  Comprehensive metabolic panel     Status: Abnormal   Collection Time: 12/22/23  3:14 PM  Result Value Ref Range   Sodium 135 135 - 145 mmol/L   Potassium 3.2 (L) 3.5 - 5.1 mmol/L   Chloride 101 98 - 111 mmol/L   CO2 23 22 - 32 mmol/L   Glucose, Bld 98 70 - 99 mg/dL    Comment: Glucose reference range applies only to samples taken after fasting for at least 8 hours.   BUN 10 6 - 20 mg/dL   Creatinine, Ser 9.30 0.61 - 1.24 mg/dL   Calcium  8.5 (L) 8.9 - 10.3 mg/dL   Total Protein 7.2 6.5 - 8.1 [name]/dL   Albumin 2.5 (L) 3.5 - 5.0 [name]/dL   AST 27 15 - 41 U/L   ALT 12 0 - 44 U/L   Alkaline Phosphatase 91 38 - 126 U/L   Total Bilirubin 0.5 0.0 - 1.2 mg/dL   GFR, Estimated >39 >39 mL/min    Comment: (NOTE) Calculated using the CKD-EPI Creatinine Equation (2021)  Anion gap 11 5 - 15    Comment: Performed at [facility], [address] [place], [place] [address]  CBC     Status: Abnormal   Collection Time: 12/22/23  3:14 PM  Result Value Ref Range   WBC 6.9 4.0 - 10.5 K/uL   RBC 2.79 (L) 4.22 - 5.81 MIL/uL   Hemoglobin 6.0 (LL) 13.0 - 17.0 [name]/dL    Comment: REPEATED TO VERIFY This result has been called to DOWD, P by  Quince Forte on 12/22/2023 16:23:40, and has been read back.    HCT 21.3 (L) 39.0 - 52.0 %   MCV 76.3 (L) 80.0 - 100.0 fL   MCH 21.5 (L) 26.0 - 34.0 pg   MCHC 28.2 (L) 30.0 - 36.0 [name]/dL   RDW 81.0 (H) 88.4 - 84.4 %   Platelets 476 (H) 150 - 400 K/uL   nRBC 0.0 0.0 - 0.2 %    Comment: Performed at [facility], [address], [place], [place] [address]  Urinalysis, Routine w reflex microscopic -Urine, Clean Catch     Status: Abnormal   Collection Time: 12/22/23  4:53 PM  Result Value Ref Range   Color, Urine STRAW (A) YELLOW   APPearance CLEAR CLEAR   Specific Gravity, Urine 1.009 1.005 - 1.030   pH 6.0 5.0 - 8.0   Glucose, UA NEGATIVE NEGATIVE mg/dL   Hgb urine dipstick NEGATIVE NEGATIVE   Bilirubin Urine NEGATIVE NEGATIVE   Ketones, ur NEGATIVE NEGATIVE mg/dL   Protein, ur NEGATIVE NEGATIVE mg/dL   Nitrite NEGATIVE NEGATIVE   Leukocytes,Ua NEGATIVE NEGATIVE    Comment: Performed at [facility], [address] [place], [place] [address]  Type and screen [facility]     Status: None (Preliminary result)   Collection Time: 12/22/23  4:53 PM  Result Value Ref Range   ABO/RH(D) O POS    Antibody Screen NEG    Sample Expiration      12/25/2023,2359 Performed at [facility], [address] [place], [place] [address]    Unit Number [id number]    Blood Component Type RED CELLS,LR    Unit division 00    Status of Unit ALLOCATED    Transfusion Status OK TO TRANSFUSE    Crossmatch Result Compatible   POC occult blood, ED     Status: Abnormal   Collection Time: 12/22/23  4:57 PM  Result Value Ref Range   Fecal Occult Bld POSITIVE (A) NEGATIVE  Prepare RBC (crossmatch)     Status: None   Collection Time: 12/22/23  5:37 PM  Result Value Ref Range   Order Confirmation      ORDER PROCESSED BY BLOOD BANK Performed at [facility], [address], [place], [place] [address]      Imaging / Studies: CT ABDOMEN PELVIS W CONTRAST Result Date: 12/22/2023 CLINICAL DATA:  Left lower quadrant pain and anemia. * Tracking Code: BO * EXAM: CT ABDOMEN AND PELVIS WITH CONTRAST TECHNIQUE: Multidetector CT imaging of the abdomen and pelvis was performed using the standard protocol following bolus administration of intravenous contrast. RADIATION DOSE REDUCTION: This exam was performed according to the departmental dose-optimization program which includes automated exposure control, adjustment of the mA and/or kV according to patient size and/or use of iterative reconstruction technique. CONTRAST:  OMNIPAQUE  IOHEXOL  300 MG/ML  SOLN COMPARISON:  01/14/2005 FINDINGS: Lower chest: Lingular scarring. Normal heart size without pericardial or pleural effusion. Three  vessel coronary artery calcification. Hepatobiliary: Mild caudate lobe enlargement. No suspicious liver lesion. Borderline gallbladder distension without calcified stone or pericholecystic edema. No biliary duct dilatation. Pancreas: Normal, without mass or ductal dilatation. Spleen: Normal in size, without focal abnormality. Adrenals/Urinary Tract: Normal adrenal glands. Bilateral extrarenal pelvis. Although there is no significant upstream dilatation, dominant stone in the left renal pelvis measures 2.1 by 3.8 cm including on 32/3. Normal urinary bladder. Stomach/Bowel: Normal stomach, without wall thickening. Involving the majority of the ascending colon is a large mass including at up to 9.8 x 8.6 x 10.3 cm. Normal terminal ileum. A diminutive appendix is likely identified on 59/2. No bowel obstruction. Vascular/Lymphatic: Aortic atherosclerosis. No abdominopelvic adenopathy. Reproductive: Normal prostate. Other: Tiny fat containing right inguinal hernia. No significant free fluid. No free intraperitoneal air. No evidence of omental or peritoneal disease. Musculoskeletal: No acute osseous abnormality. IMPRESSION: 1. Large (10.3 cm  maximally) mass centered about the ascending colon. 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 Given large size and lack of bowel obstruction, less likely differential considerations including lymphoma are possible. 2. No evidence of metastatic disease 3. Dominant 3.8 cm stone within a left extrarenal pelvis without significant obstruction. 4. Coronary artery atherosclerosis. Aortic Atherosclerosis (ICD10-I70.0). 5. Mild caudate lobe enlargement is new since 2006. Given the EMR history of 100 standard drinks of alcohol per week early cirrhosis is a concern. Report called to Dr. Lynwood at 5:55 p.m. Electronically Signed   By: Rockey Kilts M.D.   On: 12/22/2023 18:26   DG Chest 2 View Result Date: 12/22/2023 CLINICAL DATA:  Lower abdominal pain for the past week. Some nausea. Smoker. EXAM: CHEST - 2 VIEW COMPARISON:  04/17/2012 FINDINGS: Normal-sized heart. Clear lungs with normal vascularity. The lungs remain hyperexpanded with flattening of the hemidiaphragms on the lateral view. Stable mild central peribronchial thickening. Mild thoracic spine degenerative changes. IMPRESSION: 1. No acute abnormality. 2. Stable changes of COPD and chronic bronchitis. Electronically Signed   By: Elspeth Bathe M.D.   On: 12/22/2023 17:02    Medications / Allergies: per chart  Antibiotics: Anti-infectives (From admission, onward)    None         Note: Portions of this report may have been transcribed using voice recognition software. Every effort was made to ensure accuracy; however, inadvertent computerized transcription errors may be present.   Any transcriptional errors that result from this process are unintentional.    Elspeth KYM Schultze, MD, FACS, MASCRS Esophageal, Gastrointestinal & Colorectal Surgery Robotic and Minimally Invasive Surgery  [facility] [address] [place], [place] [address] [phone] Fax [phone] Main  CONTACT  INFORMATION: Weekday (9AM-5PM): Call [facility] main office at [phone] Weeknight (5PM-9AM) or Weekend/Holiday: Check EPIC Web Links tab & use AMION (password  [email or web address]) for General Surgery [facility] coverage  Please, DO NOT use SecureChat  (it is not reliable communication to reach operating surgeons & will lead to a delay in care).   Epic staff messaging available for outptient concerns needing 1-2 business day response.       12/22/2023  7:04 PM

## 2023-12-22 NOTE — ED Triage Notes (Signed)
 Pt has c/o lower abd pain going on for a week. Pt has some nausea, described the pain as nagging, stabbing.

## 2023-12-22 NOTE — ED Notes (Signed)
 Blood bank has blood ready for this patient.  Spoke with I'li,RN and notified.

## 2023-12-22 NOTE — ED Notes (Signed)
 Attempted report x 2, nurse unable to get report but has been signed up for the pt since 2011. Floor given courtesy call that we will be coming upstairs now.

## 2023-12-22 NOTE — ED Provider Triage Note (Signed)
 Emergency Medicine Provider Triage Evaluation Note  DERRYL UHER , a 62 y.o. male  was evaluated in triage.  Pt complains of lower abdominal pain (sharp) x 1 week with accompanying cough and shortness of breath x 1 month. Endorses nausea with pain worse when ambulating. watery diarrhea x 1 month.  Endorses 1 pint of whisky daily, have not drank today.  Denies fevers, vision changes, chest pain, dysuria, lower leg swelling.  Review of Systems  Positive: N/a Negative: N/a  Physical Exam  BP (!) 142/102 (BP Location: Left Arm)   Pulse 87   Temp 98.2 F (36.8 C) (Oral)   Resp 18   SpO2 99%  Gen:   Awake, no distress   Resp:  Normal effort  MSK:   Moves extremities without difficulty  Other:    Medical Decision Making  Medically screening exam initiated at 3:05 PM.  Appropriate orders placed.  [name] was informed that the remainder of the evaluation will be completed by another provider, this initial triage assessment does not replace that evaluation, and the importance of remaining in the ED until their evaluation is complete.     Beola Terrall RAMAN, [place] 12/22/23 [phone]

## 2023-12-23 ENCOUNTER — Encounter (HOSPITAL_COMMUNITY): Payer: Self-pay | Admitting: Internal Medicine

## 2023-12-23 LAB — HEMOGLOBIN AND HEMATOCRIT, BLOOD
HCT: 24.3 % — ABNORMAL LOW (ref 39.0–52.0)
HCT: 24.1 % — ABNORMAL LOW (ref 39.0–52.0)
Hemoglobin: 6.8 g/dL — CL (ref 13.0–17.0)
Hemoglobin: 7 g/dL — ABNORMAL LOW (ref 13.0–17.0)

## 2023-12-23 LAB — CBC
HCT: 23.9 % — ABNORMAL LOW (ref 39.0–52.0)
Hemoglobin: 6.7 g/dL — CL (ref 13.0–17.0)
MCH: 22 pg — ABNORMAL LOW (ref 26.0–34.0)
MCHC: 28 g/dL — ABNORMAL LOW (ref 30.0–36.0)
MCV: 78.4 fL — ABNORMAL LOW (ref 80.0–100.0)
Platelets: 447 K/uL — ABNORMAL HIGH (ref 150–400)
RBC: 3.05 MIL/uL — ABNORMAL LOW (ref 4.22–5.81)
RDW: 19.5 % — ABNORMAL HIGH (ref 11.5–15.5)
WBC: 5.4 K/uL (ref 4.0–10.5)
nRBC: 0 % (ref 0.0–0.2)

## 2023-12-23 LAB — HIV ANTIBODY (ROUTINE TESTING W REFLEX): HIV Screen 4th Generation wRfx: NONREACTIVE

## 2023-12-23 LAB — PREPARE RBC (CROSSMATCH)

## 2023-12-23 MED ORDER — PEG 3350-KCL-NA BICARB-NACL 420 G PO SOLR
4000.0000 mL | Freq: Once | ORAL | Status: AC
  Administered 2023-12-23: 4000 mL via ORAL
  Filled 2023-12-23: qty 4000

## 2023-12-23 MED ORDER — SODIUM CHLORIDE 0.9% IV SOLUTION: Freq: Once | INTRAVENOUS | Status: AC

## 2023-12-23 MED ORDER — BISACODYL 10 MG RE SUPP: 10.0000 mg | Freq: Once | RECTAL | Status: DC

## 2023-12-23 NOTE — Plan of Care (Signed)
  Problem: Education: Goal: Knowledge of General Education information will improve Description: Including pain rating scale, medication(s)/side effects and non-pharmacologic comfort measures Outcome: Progressing   Problem: Health Behavior/Discharge Planning: Goal: Ability to manage health-related needs will improve Outcome: Progressing   Problem: Clinical Measurements: Goal: Ability to maintain clinical measurements within normal limits will improve Outcome: Progressing Goal: Will remain free from infection Outcome: Progressing   Problem: Activity: Goal: Risk for activity intolerance will decrease Outcome: Progressing   Problem: Coping: Goal: Level of anxiety will decrease Outcome: Progressing   

## 2023-12-23 NOTE — Progress Notes (Signed)
 12/23/2023  Rodney [name] [id number] 12-08-1960  CARE TEAM: PCP: Rollene Almarie LABOR, MD  Outpatient Care Team: Patient Care Team: Rollene Almarie LABOR, MD as PCP - General (Internal Medicine)  Inpatient Treatment Team: Treatment Team:  Lue Elsie BROCKS, MD C[facility], Md, MD Burnette Elsie, MD Bobbette Pinon, MD Bernita Santana BRAVO, RN Vicci Eva HERO, [place] Jerona Lauraine BROCKS, RN Carolee Rosaline DASEN, RPH Hendra, Heather LABOR, LCSW   Problem List:   Principal Problem:   Colonic mass Active Problems:   Mass of hepatic flexure of colon   Anxiety   Alcohol abuse   Essential hypertension, benign   IDA (iron  deficiency anemia)   History of panic attacks   Inguinal hernia, right   Hypokalemia   Hypoalbuminemia   * No surgery found *      Assessment [facility] Stay = 1 days)      10 cm right upper quadrant mass 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  Again would be very helpful to get colonoscopy and biopsies to figure out what this is and get a plan going.  Patient tells me he is due to get colonoscopy tomorrow.  I believe Dr. Burnette with [facility] is seeing an offering advice.  At some point he would benefit from colectomy but need to get information first.  He has had unintentional weight loss, is malnourished, severely anemic, and he smokes.  That puts a much higher risk of needing a temporary ostomy.  Ideally would like to hold off on that we can.  Do not know if this is a situation where we can get him to quit smoking, correct his anemia, get his nutrition improved.  Then, plan resection with immediate anastomosis in a few weeks if he is in better shape.  I worry given his ability to comply with a close outpatient follow-up and most likely will need resection this hospitalization, especially since he has at least partial obstructing symptoms.  Dr. Ebbie  to come on service Monday to make decisions once workup complete.  Open resection plus or minus anastomosis versus ostomy depending how things go.  Patient concerned he has no insurance and no doctors.  See if social work can help get that established and since the challenge for him.  He definitely needs help taking care of things including his hypertension, tobacco abuse, etc.  Surgery help follow after colonoscopy done.  There is no indication for emergency surgery today.  Given his partial obstructive like symptoms, most likely keep him on a full liquid/pured diet for now.  May be back to clears if cannot tolerate that.  CAT scan raises concern of small right inguinal hernia.  He did not have any complaints of that he does not seem to be particularly large.  I think that is something that could benefit from being addressed at some point but it is not nearly as important as addressing the 10 cm colonic mass  -monitor electrolytes & replace as needed  Keep K>4, Mg>2, Phos>3  -VTE prophylaxis- SCDs.  Anticoagulation prophyllaxis SQ as appropriate  -mobilize as tolerated to help recovery.  Enlist therapies in moderate/high risk patients as appropriate  I updated the patient's status to the patient  Recommendations were made.  Questions were answered.  He expressed understanding & appreciation.  -Disposition: TBD    I reviewed nursing notes, hospitalist notes, last 24 h vitals and pain scores,  last 48 h intake and output, last 24 h labs and trends, and last 24 h imaging results.  I have reviewed this patient's available data, including medical history, events of note, test results, etc as part of my evaluation.   A significant portion of that time was spent in counseling. Care during the described time interval was provided by me.  This care required moderate level of medical decision making.  12/23/2023    Subjective: (Chief complaint)  Patient tolerating some liquids.  Still with  some abdominal pain but nothing too severe.  Nurse in room.  Objective:  Vital signs:  Vitals:   12/22/23 2022 12/22/23 2201 12/22/23 2320 12/23/23 0420  BP: 133/86 126/84  (!) 138/96  Pulse: 79 81  73  Resp: 18 18  18   Temp: 98.9 F (37.2 C) 98.4 F (36.9 C)  98.2 F (36.8 C)  TempSrc: Oral Oral  Oral  SpO2: 100% 95%  96%  Weight:   77.2 kg   Height:   5' 9 (1.753 m)     Last BM Date : 12/23/23  Intake/Output   Yesterday:  08/08 0701 - 08/09 0700 In: 843.4 [I.V.:298.4; Blood:545] Out: -  This shift:  No intake/output data recorded.  Bowel function:  Flatus: YES  BM:  No  Drain: (No drain)   Physical Exam:  General: Pt awake/alert in no acute distress Eyes: PERRL, normal EOM.  Sclera clear.  No icterus Neuro: CN II-XII intact w/o focal sensory/motor deficits. Lymph: No head/neck/groin lymphadenopathy Psych:  No delerium/psychosis/paranoia.  Oriented x 4 HENT: Normocephalic, Mucus membranes moist.  No thrush Neck: Supple, No tracheal deviation.  No obvious thyromegaly Chest: No pain to chest wall compression.  Good respiratory excursion.  No audible wheezing CV:  Pulses intact.  Regular rhythm.  No major extremity edema MS: Normal AROM mjr joints.  No obvious deformity  Abdomen: Soft.  Mildy distended.  Nontender.  No evidence of peritonitis.  No incarcerated hernias.  Some mild fullness in upper abdomen but no definite hard abdominal mass that I can tell.  Ext:  No deformity.  No mjr edema.  No cyanosis Skin: No petechiae / purpurea.  No major sores.  Warm and dry    Results:   Cultures: No results found for this or any previous visit (from the past 720 hours).  Labs: Results for orders placed or performed during the hospital encounter of 12/22/23 (from the past 48 hours)  ABO/Rh     Status: None   Collection Time: 12/22/23  2:47 PM  Result Value Ref Range   ABO/RH(D)      O POS Performed at [facility], [address] [place], [place] [address]   Lipase, blood     Status: None   Collection Time: 12/22/23  3:14 PM  Result Value Ref Range   Lipase 36 11 - 51 U/L    Comment: Performed at [facility], [address], [place], [place] [address]  Comprehensive metabolic panel     Status: Abnormal   Collection Time: 12/22/23  3:14 PM  Result Value Ref Range   Sodium 135 135 - 145 mmol/L   Potassium 3.2 (L) 3.5 - 5.1 mmol/L   Chloride 101 98 - 111 mmol/L   CO2 23 22 - 32 mmol/L   Glucose, Bld 98 70 - 99 mg/dL    Comment: Glucose reference range applies only to samples taken after fasting for at least 8 hours.   BUN  10 6 - 20 mg/dL   Creatinine, Ser 9.30 0.61 - 1.24 mg/dL   Calcium  8.5 (L) 8.9 - 10.3 mg/dL   Total Protein 7.2 6.5 - 8.1 g/dL   Albumin 2.5 (L) 3.5 - 5.0 g/dL   AST 27 15 - 41 U/L   ALT 12 0 - 44 U/L   Alkaline Phosphatase 91 38 - 126 U/L   Total Bilirubin 0.5 0.0 - 1.2 mg/dL   GFR, Estimated >39 >39 mL/min    Comment: (NOTE) Calculated using the CKD-EPI Creatinine Equation (2021)    Anion gap 11 5 - 15    Comment: Performed at [facility], [address], [place], [place] [address]  CBC     Status: Abnormal   Collection Time: 12/22/23  3:14 PM  Result Value Ref Range   WBC 6.9 4.0 - 10.5 K/uL   RBC 2.79 (L) 4.22 - 5.81 MIL/uL   Hemoglobin 6.0 (LL) 13.0 - 17.0 g/dL    Comment: REPEATED TO VERIFY This result has been called to DOWD, P by Quince Forte on 12/22/2023 16:23:40, and has been read back.    HCT 21.3 (L) 39.0 - 52.0 %   MCV 76.3 (L) 80.0 - 100.0 fL   MCH 21.5 (L) 26.0 - 34.0 pg   MCHC 28.2 (L) 30.0 - 36.0 g/dL   RDW 81.0 (H) 88.4 - 84.4 %   Platelets 476 (H) 150 - 400 K/uL   nRBC 0.0 0.0 - 0.2 %    Comment: Performed at [facility], [address], [place], [place] [address]  Urinalysis, Routine w reflex microscopic -Urine, Clean Catch     Status: Abnormal   Collection Time: 12/22/23  4:53 PM  Result Value  Ref Range   Color, Urine STRAW (A) YELLOW   APPearance CLEAR CLEAR   Specific Gravity, Urine 1.009 1.005 - 1.030   pH 6.0 5.0 - 8.0   Glucose, UA NEGATIVE NEGATIVE mg/dL   Hgb urine dipstick NEGATIVE NEGATIVE   Bilirubin Urine NEGATIVE NEGATIVE   Ketones, ur NEGATIVE NEGATIVE mg/dL   Protein, ur NEGATIVE NEGATIVE mg/dL   Nitrite NEGATIVE NEGATIVE   Leukocytes,Ua NEGATIVE NEGATIVE    Comment: Performed at [facility], [address], [place], [place] [address]  Type and screen [facility]     Status: None (Preliminary result)   Collection Time: 12/22/23  4:53 PM  Result Value Ref Range   ABO/RH(D) O POS    Antibody Screen NEG    Sample Expiration 12/25/2023,2359    Unit Number [id number]    Blood Component Type RED CELLS,LR    Unit division 00    Status of Unit ISSUED    Transfusion Status OK TO TRANSFUSE    Crossmatch Result      Compatible Performed at [facility], [address], [place], [place] [address]   POC occult blood, ED     Status: Abnormal   Collection Time: 12/22/23  4:57 PM  Result Value Ref Range   Fecal Occult Bld POSITIVE (A) NEGATIVE  Prepare RBC (crossmatch)     Status: None   Collection Time: 12/22/23  5:37 PM  Result Value Ref Range   Order Confirmation      ORDER PROCESSED BY BLOOD BANK Performed at [facility], [address], [place], [place] [address]   Vitamin B12     Status: Abnormal   Collection Time: 12/22/23  6:24 PM  Result Value  Ref Range   Vitamin B-12 1,045 (H) 180 - 914 pg/mL    Comment: (NOTE) This assay is not validated for testing neonatal or myeloproliferative syndrome specimens for Vitamin B12 levels. Performed at [facility], [address] [place], [place] [address]   Folate     Status: None   Collection Time: 12/22/23  6:24 PM  Result Value Ref Range   Folate 8.6 >5.9 ng/mL    Comment: Performed at [facility], [address] [place], [place] [address]  Iron  and TIBC     Status: Abnormal   Collection Time: 12/22/23  6:24 PM  Result Value Ref Range   Iron  11 (L) 45 - 182 ug/dL   TIBC 691 749 - 549 ug/dL   Saturation Ratios 4 (L) 17.9 - 39.5 %   UIBC 297 ug/dL    Comment: Performed at [facility], [address], [place], [place] [address]  Ferritin     Status: Abnormal   Collection Time: 12/22/23  6:24 PM  Result Value Ref Range   Ferritin 4 (L) 24 - 336 ng/mL    Comment: Performed at [facility], [address] [place], [place] [address]  Magnesium      Status: Abnormal   Collection Time: 12/22/23  6:46 PM  Result Value Ref Range   Magnesium  1.4 (L) 1.7 - 2.4 mg/dL    Comment: Performed at [facility], [address], [place], [place] [address]  Phosphorus     Status: None   Collection Time: 12/22/23  6:46 PM  Result Value Ref Range   Phosphorus 2.9 2.5 - 4.6 mg/dL    Comment: Performed at [facility], [address], [place], [place] [address]  Prealbumin     Status: Abnormal   Collection Time: 12/22/23  6:46 PM  Result Value Ref Range   Prealbumin 7 (L) 18 - 38 mg/dL    Comment: Performed at [facility], [address] [place], [place] [address]  Reticulocytes     Status: Abnormal   Collection Time: 12/22/23  7:30 PM  Result Value Ref Range   Retic Ct Pct 2.2 0.4 - 3.1 %   RBC. 2.54 (L) 4.22 - 5.81 MIL/uL   Retic Count, Absolute 55.6 19.0 - 186.0 K/uL   Immature Retic Fract 31.4 (H) 2.3 - 15.9 %    Comment: Performed at [facility], [address] [place], [place] [address]    Imaging / Studies: CT ABDOMEN PELVIS W CONTRAST Result Date: 12/22/2023 CLINICAL DATA:  Left lower quadrant pain and anemia. * Tracking Code: BO * EXAM: CT ABDOMEN AND PELVIS WITH CONTRAST TECHNIQUE: Multidetector CT imaging of the abdomen and pelvis was performed using the standard  protocol following bolus administration of intravenous contrast. RADIATION DOSE REDUCTION: This exam was performed according to the departmental dose-optimization program which includes automated exposure control, adjustment of the mA and/or kV according to patient size and/or use of iterative reconstruction technique. CONTRAST:  OMNIPAQUE  IOHEXOL  300 MG/ML  SOLN COMPARISON:  01/14/2005 FINDINGS: Lower chest: Lingular scarring. Normal heart size without pericardial or pleural effusion. Three vessel coronary artery calcification. Hepatobiliary: Mild caudate lobe enlargement. No suspicious liver lesion. Borderline gallbladder distension without calcified stone or pericholecystic edema. No biliary duct dilatation. Pancreas: Normal, without mass or ductal dilatation. Spleen: Normal in size, without focal abnormality. Adrenals/Urinary Tract: Normal adrenal glands. Bilateral extrarenal pelvis. Although there is no significant upstream  dilatation, dominant stone in the left renal pelvis measures 2.1 by 3.8 cm including on 32/3. Normal urinary bladder. Stomach/Bowel: Normal stomach, without wall thickening. Involving the majority of the ascending colon is a large mass including at up to 9.8 x 8.6 x 10.3 cm. Normal terminal ileum. A diminutive appendix is likely identified on 59/2. No bowel obstruction. Vascular/Lymphatic: Aortic atherosclerosis. No abdominopelvic adenopathy. Reproductive: Normal prostate. Other: Tiny fat containing right inguinal hernia. No significant free fluid. No free intraperitoneal air. No evidence of omental or peritoneal disease. Musculoskeletal: No acute osseous abnormality. IMPRESSION: 1. Large (10.3 cm maximally) mass centered about the ascending colon. 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 Given large size and lack of bowel obstruction, less likely differential considerations including lymphoma are possible. 2. No evidence of metastatic disease 3. Dominant 3.8 cm stone within a left extrarenal  pelvis without significant obstruction. 4. Coronary artery atherosclerosis. Aortic Atherosclerosis (ICD10-I70.0). 5. Mild caudate lobe enlargement is new since 2006. Given the EMR history of 100 standard drinks of alcohol per week early cirrhosis is a concern. Report called to Dr. Lynwood at 5:55 p.m. Electronically Signed   By: Rockey Kilts M.D.   On: 12/22/2023 18:26   DG Chest 2 View Result Date: 12/22/2023 CLINICAL DATA:  Lower abdominal pain for the past week. Some nausea. Smoker. EXAM: CHEST - 2 VIEW COMPARISON:  04/17/2012 FINDINGS: Normal-sized heart. Clear lungs with normal vascularity. The lungs remain hyperexpanded with flattening of the hemidiaphragms on the lateral view. Stable mild central peribronchial thickening. Mild thoracic spine degenerative changes. IMPRESSION: 1. No acute abnormality. 2. Stable changes of COPD and chronic bronchitis. Electronically Signed   By: Elspeth Bathe M.D.   On: 12/22/2023 17:02    Medications / Allergies: per chart  Antibiotics: Anti-infectives (From admission, onward)    None         Note: Portions of this report may have been transcribed using voice recognition software. Every effort was made to ensure accuracy; however, inadvertent computerized transcription errors may be present.   Any transcriptional errors that result from this process are unintentional.    Elspeth KYM Schultze, MD, FACS, MASCRS Esophageal, Gastrointestinal & Colorectal Surgery Robotic and Minimally Invasive Surgery  [facility] [address] [place], [place] [address] [phone] Fax [phone] Main  CONTACT INFORMATION: Weekday (9AM-5PM): Call [facility] main office at [phone] Weeknight (5PM-9AM) or Weekend/Holiday: Check EPIC Web Links tab & use AMION (password  [email or web address]) for General Surgery [facility] coverage  Please, DO NOT use SecureChat  (it is not reliable communication to reach operating surgeons  & will lead to a delay in care).   Epic staff messaging available for outptient concerns needing 1-2 business day response.      12/23/2023  9:16 AM

## 2023-12-23 NOTE — Progress Notes (Signed)
 PROGRESS NOTE    Rodney [name]  FMW:[id number] DOB: 1960/10/25 DOA: 12/22/2023 PCP: Rollene Almarie LABOR, MD   Brief Narrative:  Rodney [name] is a 63 y.o. male with medical history significant for alcohol abuse, drinks 1 pint of whiskey daily(last drink just prior to admission), current tobacco abuse, 50-pack-year history, who presents to the ER with worsening abdominal pain noted to be markedly anemic with positive FOBT.  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  Hospitalist called for admission, general surgery and GI called in consult.  Assessment & Plan:   Principal Problem:   Colonic mass Active Problems:   Mass of hepatic flexure of colon   Anxiety   Alcohol abuse   Essential hypertension, benign   IDA (iron  deficiency anemia)   History of panic attacks   Inguinal hernia, right   Hypokalemia   Hypoalbuminemia  Acute symptomatic anemia Acute blood loss anemia on chronic iron  deficiency anemia versus chronic disease Hemoglobin 6.0 with positive FOBT 2 unit PRBC 8/9 -repeat labs pending Likely benefit from iron  supplementation at discharge  Abdominal pain secondary to large colonic mass Noted on CT, differential adenocarcinoma, lymphoma General Surgery and GI consulted by EDP Endoscopy per GI 12/24/2023  Questionable cirrhosis Noted on CT, will need further testing to confirm  Appreciate GI recommendations   Alcohol abuse  High risk for alcohol withdrawal 1 pint liquor per day, last drink 12/22/2023 Patient is not interested in quitting. CIWA protocol in place -highest risk for withdrawals on 8/11, 8/12   Tobacco abuse 50-pack-year history Tobacco cessation counseling done at bedside, the patient is not interested in quitting.  Coronary artery atherosclerosis.  Aortic atherosclerosis. Denies any anginal symptoms. Continue to monitor on telemetry   Dominant 3.8 cm stone within the left extra  renal pelvis without significant obstruction, incidental finding.  Currently asymptomatic Continue IV fluid hydration   Moderate protein caloric malnutrition  Hypomagnesemia Hypokalemia Likely secondary to poor p.o. intake, abdominal mass/cancer and alcohol abuse  Follow repeat labs   DVT prophylaxis: SCDs Start: 12/22/23 1936 Code Status:   Code Status: Full Code Family Communication: None present  Status is: Inpatient  Dispo: The patient is from: Home              Anticipated d/c is to: Home              Anticipated d/c date is: 48 to 72 hours              Patient currently not medically stable for discharge  Consultants:  GI, general surgery  Procedures:  Endoscopy per GI tentatively planned 12/24/2023  Antimicrobials:  None  Subjective: No acute issues or events overnight denies nausea vomiting diarrhea constipation exertional chest pain  Objective: Vitals:   12/22/23 2022 12/22/23 2201 12/22/23 2320 12/23/23 0420  BP: 133/86 126/84  (!) 138/96  Pulse: 79 81  73  Resp: 18 18  18   Temp: 98.9 F (37.2 C) 98.4 F (36.9 C)  98.2 F (36.8 C)  TempSrc: Oral Oral  Oral  SpO2: 100% 95%  96%  Weight:   77.2 kg   Height:   5' 9 (1.753 m)     Intake/Output Summary (Last 24 hours) at 12/23/2023 0816 Last data filed at 12/23/2023 0513 Gross per 24 hour  Intake 843.39 ml  Output --  Net 843.39 ml   Filed Weights   12/22/23 2320  Weight: 77.2 kg    Examination:  General exam: Appears calm and comfortable  Respiratory system: Clear to auscultation. Respiratory effort normal. Cardiovascular system: S1 & S2 heard, RRR. No JVD, murmurs, rubs, gallops or clicks. No pedal edema. Gastrointestinal system: Abdomen is nondistended, soft and nontender. No organomegaly or masses felt. Normal bowel sounds heard. Central nervous system: Alert and oriented. No focal neurological deficits. Extremities: Symmetric 5 x 5 power. Skin: No rashes, lesions or ulcers Psychiatry:  Judgement and insight appear normal. Mood & affect appropriate.     Data Reviewed: I have personally reviewed following labs and imaging studies  CBC: Recent Labs  Lab 12/22/23 1514  WBC 6.9  HGB 6.0*  HCT 21.3*  MCV 76.3*  PLT 476*   Basic Metabolic Panel: Recent Labs  Lab 12/22/23 1514 12/22/23 1846  NA 135  --   K 3.2*  --   CL 101  --   CO2 23  --   GLUCOSE 98  --   BUN 10  --   CREATININE 0.69  --   CALCIUM  8.5*  --   MG  --  1.4*  PHOS  --  2.9   GFR: Estimated Creatinine Clearance: 98.2 mL/min (by C-G formula based on SCr of 0.69 mg/dL). Liver Function Tests: Recent Labs  Lab 12/22/23 1514  AST 27  ALT 12  ALKPHOS 91  BILITOT 0.5  PROT 7.2  ALBUMIN 2.5*   Recent Labs  Lab 12/22/23 1514  LIPASE 36   No results for input(s): AMMONIA in the last 168 hours. Coagulation Profile: No results for input(s): INR, PROTIME in the last 168 hours. Cardiac Enzymes: No results for input(s): CKTOTAL, CKMB, CKMBINDEX, TROPONINI in the last 168 hours. BNP (last 3 results) No results for input(s): PROBNP in the last 8760 hours. HbA1C: No results for input(s): HGBA1C in the last 72 hours. CBG: No results for input(s): GLUCAP in the last 168 hours. Lipid Profile: No results for input(s): CHOL, HDL, LDLCALC, TRIG, CHOLHDL, LDLDIRECT in the last 72 hours. Thyroid Function Tests: No results for input(s): TSH, T4TOTAL, FREET4, T3FREE, THYROIDAB in the last 72 hours. Anemia Panel: Recent Labs    12/22/23 1824 12/22/23 1930  VITAMINB12 1,045*  --   FOLATE 8.6  --   FERRITIN 4*  --   TIBC 308  --   IRON  11*  --   RETICCTPCT  --  2.2   Sepsis Labs: No results for input(s): PROCALCITON, LATICACIDVEN in the last 168 hours.  No results found for this or any previous visit (from the past 240 hours).   Radiology Studies: CT ABDOMEN PELVIS W CONTRAST Result Date: 12/22/2023 CLINICAL DATA:  Left lower quadrant pain and  anemia. * Tracking Code: BO * EXAM: CT ABDOMEN AND PELVIS WITH CONTRAST TECHNIQUE: Multidetector CT imaging of the abdomen and pelvis was performed using the standard protocol following bolus administration of intravenous contrast. RADIATION DOSE REDUCTION: This exam was performed according to the departmental dose-optimization program which includes automated exposure control, adjustment of the mA and/or kV according to patient size and/or use of iterative reconstruction technique. CONTRAST:  OMNIPAQUE  IOHEXOL  300 MG/ML  SOLN COMPARISON:  01/14/2005 FINDINGS: Lower chest: Lingular scarring. Normal heart size without pericardial or pleural effusion. Three vessel coronary artery calcification. Hepatobiliary: Mild caudate lobe enlargement. No suspicious liver lesion. Borderline gallbladder distension without calcified stone or pericholecystic edema. No biliary duct dilatation. Pancreas: Normal, without mass or ductal dilatation. Spleen: Normal in size, without focal abnormality. Adrenals/Urinary Tract: Normal adrenal glands. Bilateral extrarenal pelvis. Although there  is no significant upstream dilatation, dominant stone in the left renal pelvis measures 2.1 by 3.8 cm including on 32/3. Normal urinary bladder. Stomach/Bowel: Normal stomach, without wall thickening. Involving the majority of the ascending colon is a large mass including at up to 9.8 x 8.6 x 10.3 cm. Normal terminal ileum. A diminutive appendix is likely identified on 59/2. No bowel obstruction. Vascular/Lymphatic: Aortic atherosclerosis. No abdominopelvic adenopathy. Reproductive: Normal prostate. Other: Tiny fat containing right inguinal hernia. No significant free fluid. No free intraperitoneal air. No evidence of omental or peritoneal disease. Musculoskeletal: No acute osseous abnormality. IMPRESSION: 1. Large (10.3 cm maximally) mass centered about the ascending colon. 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 Given large size and lack of bowel  obstruction, less likely differential considerations including lymphoma are possible. 2. No evidence of metastatic disease 3. Dominant 3.8 cm stone within a left extrarenal pelvis without significant obstruction. 4. Coronary artery atherosclerosis. Aortic Atherosclerosis (ICD10-I70.0). 5. Mild caudate lobe enlargement is new since 2006. Given the EMR history of 100 standard drinks of alcohol per week early cirrhosis is a concern. Report called to Dr. Lynwood at 5:55 p.m. Electronically Signed   By: Rockey Kilts M.D.   On: 12/22/2023 18:26   DG Chest 2 View Result Date: 12/22/2023 CLINICAL DATA:  Lower abdominal pain for the past week. Some nausea. Smoker. EXAM: CHEST - 2 VIEW COMPARISON:  04/17/2012 FINDINGS: Normal-sized heart. Clear lungs with normal vascularity. The lungs remain hyperexpanded with flattening of the hemidiaphragms on the lateral view. Stable mild central peribronchial thickening. Mild thoracic spine degenerative changes. IMPRESSION: 1. No acute abnormality. 2. Stable changes of COPD and chronic bronchitis. Electronically Signed   By: Elspeth Bathe M.D.   On: 12/22/2023 17:02   Scheduled Meds:  sodium chloride    Intravenous Once   folic acid   1 mg Oral Daily   multivitamins with iron   1 tablet Oral Daily   polycarbophil  625 mg Oral BID   polyethylene glycol  17 g Oral Daily   potassium chloride   40 mEq Oral Daily   thiamine   100 mg Oral Daily   Or   thiamine   100 mg Intravenous Daily   Continuous Infusions:  lactated ringers      lactated ringers  50 mL/hr at 12/23/23 0513     LOS: 1 day   Time spent:  Elsie JAYSON Montclair, DO [facility]  If 7PM-7AM, please contact night-coverage [email or web address]  12/23/2023, 8:16 AM

## 2023-12-23 NOTE — H&P (View-Only) (Signed)
 [facility] Consultation Note  Referring Provider: [facility] Primary Care Physician:  Rollene Almarie LABOR, MD Primary Gastroenterologist:  [place]  Reason for Consultation:  abnormal CT  HPI: Rodney [name] is a 63 y.o. male presenting lower abdominal pain.  Found to have suspected mass ascending colon.  Has lost about 15 lbs one year.  Has low albumin and microcytic iron -deficiency anemia.  No prior colonoscopy.   Past Medical History:  Diagnosis Date   Anxiety state, unspecified    Calculus of kidney    Lumbago    Personal history of other diseases of digestive system     History reviewed. No pertinent surgical history.  Prior to Admission medications   Not on File    Current Facility-Administered Medications  Medication Dose Route Frequency Provider Last Rate Last Admin   0.9 %  sodium chloride  infusion (Manually program via Guardrails IV Fluids)   Intravenous Once Randol Simmonds, MD       acetaminophen  (TYLENOL ) tablet 650 mg  650 mg Oral Q6H PRN Hall, Carole N, DO       alum & mag hydroxide-simeth (MAALOX/MYLANTA) 200-200-20 MG/5ML suspension 30 mL  30 mL Oral Q6H PRN Sheldon Standing, MD       bisacodyl  (DULCOLAX) suppository 10 mg  10 mg Rectal Q12H PRN Sheldon Standing, MD       LORazepam  (ATIVAN ) tablet 1-4 mg  1-4 mg Oral Q1H PRN Shona Terry SAILOR, DO       Or   diazepam  (VALIUM ) injection 2.5 mg  2.5 mg Intravenous Q1H PRN Shona Terry N, DO       folic acid  (FOLVITE ) tablet 1 mg  1 mg Oral Daily Hico, Carole N, DO   1 mg at 12/23/23 9082   hydrALAZINE  (APRESOLINE ) injection 5-10 mg  5-10 mg Intravenous Q4H PRN Sheldon Standing, MD       ipratropium-albuterol  (DUONEB) 0.5-2.5 (3) MG/3ML nebulizer solution 3 mL  3 mL Nebulization Q2H PRN Shona, Carole N, DO       lactated ringers  bolus 1,000 mL  1,000 mL Intravenous Q8H PRN Sheldon Standing, MD       lactated ringers  infusion   Intravenous Continuous Shona Terry SAILOR, DO 50 mL/hr at 12/23/23 9486 Infusion Verify at  12/23/23 9486   magic mouthwash  15 mL Oral QID PRN Sheldon Standing, MD       melatonin tablet 5 mg  5 mg Oral QHS PRN Shona Terry SAILOR, DO       menthol -cetylpyridinium (CEPACOL) lozenge 3 mg  1 lozenge Oral PRN Sheldon Standing, MD       metoprolol  tartrate (LOPRESSOR ) injection 5 mg  5 mg Intravenous Q6H PRN Sheldon Standing, MD       morphine  (PF) 2 MG/ML injection 2 mg  2 mg Intravenous Q4H PRN Hall, Carole N, DO       multivitamins with iron  tablet 1 tablet  1 tablet Oral Daily Sheldon Standing, MD   1 tablet at 12/23/23 9082   ondansetron  (ZOFRAN ) injection 4 mg  4 mg Intravenous Q6H PRN Sheldon Standing, MD       oxyCODONE  (Oxy IR/ROXICODONE ) immediate release tablet 5 mg  5 mg Oral Q6H PRN Hall, Carole N, DO       phenol (CHLORASEPTIC) mouth spray 2 spray  2 spray Mouth/Throat PRN Sheldon Standing, MD       polycarbophil (FIBERCON) tablet 625 mg  625 mg Oral BID Sheldon Standing, MD   625 mg at 12/23/23 [phone]  polyethylene glycol (MIRALAX  / GLYCOLAX ) packet 17 g  17 g Oral Daily Sheldon Standing, MD   17 g at 12/22/23 2141   polyethylene glycol (MIRALAX  / GLYCOLAX ) packet 17 g  17 g Oral Daily PRN Shona Terry SAILOR, DO       potassium chloride  SA (KLOR-CON  M) CR tablet 40 mEq  40 mEq Oral Daily Sheldon Standing, MD   40 mEq at 12/23/23 9082   simethicone  (MYLICON) 40 MG/0.6ML suspension 80 mg  80 mg Oral QID PRN Sheldon Standing, MD       thiamine  (VITAMIN B1) tablet 100 mg  100 mg Oral Daily Shona Terry N, DO   100 mg at 12/23/23 9082   Or   thiamine  (VITAMIN B1) injection 100 mg  100 mg Intravenous Daily Shona Terry N, DO        Allergies as of 12/22/2023   (No Known Allergies)    Family History  Problem Relation Age of Onset   Cancer Neg Hx    Heart disease Neg Hx    Hyperlipidemia Neg Hx    Hypertension Neg Hx    Kidney disease Neg Hx    Stroke Neg Hx     Social History   Socioeconomic History   Marital status: Married    Spouse name: Not on file   Number of children: 0   Years of education: 12    Highest education level: Not on file  Occupational History   Occupation: Architect: [employer]  Tobacco Use   Smoking status: Every Day    Current packs/day: 0.50    Types: Cigarettes   Smokeless tobacco: Never  Substance and Sexual Activity   Alcohol use: Yes    Alcohol/week: 100.0 standard drinks of alcohol    Types: 100 Shots of liquor per week    Comment: Drinks 1 pint of 80 proof whiskey a night (16 oz)   Drug use: No   Sexual activity: Not on file  Other Topics Concern   Not on file  Social History Narrative   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 [name] ([employer]), has been there for 26 years ('13). Wife married in '07, marriage in good health. SO: torticollis, operation '10 to remove 3 discs   Social Drivers of Corporate investment banker Strain: Not on file  Food Insecurity: Not on file  Transportation Needs: Not on file  Physical Activity: Not on file  Stress: Not on file  Social Connections: Not on file  Intimate Partner Violence: Not on file    Review of Systems: As per HPI, all others negative  Physical Exam: Vital signs in last 24 hours: Temp:  [98.2 F (36.8 C)-99.2 F (37.3 C)] 98.2 F (36.8 C) (08/09 0420) Pulse Rate:  [73-87] 73 (08/09 0420) Resp:  [15-18] 18 (08/09 0420) BP: (126-152)/(84-103) 138/96 (08/09 0420) SpO2:  [95 %-100 %] 96 % (08/09 0420) Weight:  [77.2 kg] 77.2 kg (08/08 2320) Last BM Date : 12/23/23 General:   Alert,  Well-developed, well-nourished, pleasant and cooperative in NAD Head:  Normocephalic and atraumatic. Eyes:  Sclera clear, no icterus.   Conjunctiva pink. Ears:  Normal auditory acuity. Nose:  No deformity, discharge,  or lesions. Mouth:  No deformity or lesions.  Oropharynx pink & moist. Neck:  Supple; no masses or thyromegaly. Lungs:  No visible distress Abdomen:  Soft, nontender and nondistended. No masses, hepatosplenomegaly or hernias noted. Normal bowel  sounds, without  guarding, and without rebound.     Msk:  Symmetrical without gross deformities. Normal posture. Pulses:  Normal pulses noted. Extremities:  Without clubbing or edema. Neurologic:  Alert and  oriented x4;  grossly normal neurologically. Skin:  Intact without significant lesions or rashes. Psych:  Alert and cooperative. Normal mood and affect.   Lab Results: Recent Labs    12/22/23 1514  WBC 6.9  HGB 6.0*  HCT 21.3*  PLT 476*   BMET Recent Labs    12/22/23 1514  NA 135  K 3.2*  CL 101  CO2 23  GLUCOSE 98  BUN 10  CREATININE 0.69  CALCIUM  8.5*   LFT Recent Labs    12/22/23 1514  PROT 7.2  ALBUMIN 2.5*  AST 27  ALT 12  ALKPHOS 91  BILITOT 0.5   PT/INR No results for input(s): LABPROT, INR in the last 72 hours.  Studies/Results: CT ABDOMEN PELVIS W CONTRAST Result Date: 12/22/2023 CLINICAL DATA:  Left lower quadrant pain and anemia. * Tracking Code: BO * EXAM: CT ABDOMEN AND PELVIS WITH CONTRAST TECHNIQUE: Multidetector CT imaging of the abdomen and pelvis was performed using the standard protocol following bolus administration of intravenous contrast. RADIATION DOSE REDUCTION: This exam was performed according to the departmental dose-optimization program which includes automated exposure control, adjustment of the mA and/or kV according to patient size and/or use of iterative reconstruction technique. CONTRAST:  OMNIPAQUE  IOHEXOL  300 MG/ML  SOLN COMPARISON:  01/14/2005 FINDINGS: Lower chest: Lingular scarring. Normal heart size without pericardial or pleural effusion. Three vessel coronary artery calcification. Hepatobiliary: Mild caudate lobe enlargement. No suspicious liver lesion. Borderline gallbladder distension without calcified stone or pericholecystic edema. No biliary duct dilatation. Pancreas: Normal, without mass or ductal dilatation. Spleen: Normal in size, without focal abnormality. Adrenals/Urinary Tract: Normal adrenal glands.  Bilateral extrarenal pelvis. Although there is no significant upstream dilatation, dominant stone in the left renal pelvis measures 2.1 by 3.8 cm including on 32/3. Normal urinary bladder. Stomach/Bowel: Normal stomach, without wall thickening. Involving the majority of the ascending colon is a large mass including at up to 9.8 x 8.6 x 10.3 cm. Normal terminal ileum. A diminutive appendix is likely identified on 59/2. No bowel obstruction. Vascular/Lymphatic: Aortic atherosclerosis. No abdominopelvic adenopathy. Reproductive: Normal prostate. Other: Tiny fat containing right inguinal hernia. No significant free fluid. No free intraperitoneal air. No evidence of omental or peritoneal disease. Musculoskeletal: No acute osseous abnormality. IMPRESSION: 1. Large (10.3 cm maximally) mass centered about the ascending colon. 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 Given large size and lack of bowel obstruction, less likely differential considerations including lymphoma are possible. 2. No evidence of metastatic disease 3. Dominant 3.8 cm stone within a left extrarenal pelvis without significant obstruction. 4. Coronary artery atherosclerosis. Aortic Atherosclerosis (ICD10-I70.0). 5. Mild caudate lobe enlargement is new since 2006. Given the EMR history of 100 standard drinks of alcohol per week early cirrhosis is a concern. Report called to Dr. Lynwood at 5:55 p.m. Electronically Signed   By: Rockey Kilts M.D.   On: 12/22/2023 18:26   DG Chest 2 View Result Date: 12/22/2023 CLINICAL DATA:  Lower abdominal pain for the past week. Some nausea. Smoker. EXAM: CHEST - 2 VIEW COMPARISON:  04/17/2012 FINDINGS: Normal-sized heart. Clear lungs with normal vascularity. The lungs remain hyperexpanded with flattening of the hemidiaphragms on the lateral view. Stable mild central peribronchial thickening. Mild thoracic spine degenerative changes. IMPRESSION: 1. No acute abnormality. 2. Stable changes of COPD and chronic bronchitis. Electronically  Signed  By: Elspeth Bathe M.D.   On: 12/22/2023 17:02    Impression:   Abdominal pain. Weight loss. Abnormal imaging:  Suspected mass ascending colon.  Caudate lobe enlargement.  Plan:   Clear liquid diet, NPO after midnight. Colonoscopy for further evaluation tomorrow. I am not convinced patient has cirrhosis of liver, but this can't be excluded; in any event, there is no imaging of portal hypertension or anything that would impart prohibitive operative risk. Risks (bleeding, infection, bowel perforation that could require surgery, sedation-related changes in cardiopulmonary systems), benefits (identification and possible treatment of source of symptoms, exclusion of certain causes of symptoms), and alternatives (watchful waiting, radiographic imaging studies, empiric medical treatment) of colonoscopy were explained to patient/family in detail and patient wishes to proceed.  [facility] will follow.   LOS: 1 day   Carla Whilden M  12/23/2023, 9:43 AM  Cell [phone] If no answer or after 5 PM call [phone]

## 2023-12-23 NOTE — Progress Notes (Signed)
 Pt given prep for colonoscopy tomorrow. Pt drank half of prep and refusing to drink anymore. States he threw up x3 r/t trying to force himself to drink it. States stool is watery and yellow tinge. A chavez notified tonight. No new orders.

## 2023-12-23 NOTE — Plan of Care (Signed)

## 2023-12-23 NOTE — Consult Note (Signed)
 [facility] Consultation Note  Referring Provider: [facility] Primary Care Physician:  Rollene Almarie LABOR, MD Primary Gastroenterologist:  [place]  Reason for Consultation:  abnormal CT  HPI: Rodney [name] is a 63 y.o. male presenting lower abdominal pain.  Found to have suspected mass ascending colon.  Has lost about 15 lbs one year.  Has low albumin and microcytic iron -deficiency anemia.  No prior colonoscopy.   Past Medical History:  Diagnosis Date   Anxiety state, unspecified    Calculus of kidney    Lumbago    Personal history of other diseases of digestive system     History reviewed. No pertinent surgical history.  Prior to Admission medications   Not on File    Current Facility-Administered Medications  Medication Dose Route Frequency Provider Last Rate Last Admin   0.9 %  sodium chloride  infusion (Manually program via Guardrails IV Fluids)   Intravenous Once Randol Simmonds, MD       acetaminophen  (TYLENOL ) tablet 650 mg  650 mg Oral Q6H PRN Hall, Carole N, DO       alum & mag hydroxide-simeth (MAALOX/MYLANTA) 200-200-20 MG/5ML suspension 30 mL  30 mL Oral Q6H PRN Sheldon Standing, MD       bisacodyl  (DULCOLAX) suppository 10 mg  10 mg Rectal Q12H PRN Sheldon Standing, MD       LORazepam  (ATIVAN ) tablet 1-4 mg  1-4 mg Oral Q1H PRN Shona Terry SAILOR, DO       Or   diazepam  (VALIUM ) injection 2.5 mg  2.5 mg Intravenous Q1H PRN Shona Terry N, DO       folic acid  (FOLVITE ) tablet 1 mg  1 mg Oral Daily Hico, Carole N, DO   1 mg at 12/23/23 9082   hydrALAZINE  (APRESOLINE ) injection 5-10 mg  5-10 mg Intravenous Q4H PRN Sheldon Standing, MD       ipratropium-albuterol  (DUONEB) 0.5-2.5 (3) MG/3ML nebulizer solution 3 mL  3 mL Nebulization Q2H PRN Shona, Carole N, DO       lactated ringers  bolus 1,000 mL  1,000 mL Intravenous Q8H PRN Sheldon Standing, MD       lactated ringers  infusion   Intravenous Continuous Shona Terry SAILOR, DO 50 mL/hr at 12/23/23 9486 Infusion Verify at  12/23/23 9486   magic mouthwash  15 mL Oral QID PRN Sheldon Standing, MD       melatonin tablet 5 mg  5 mg Oral QHS PRN Shona Terry SAILOR, DO       menthol -cetylpyridinium (CEPACOL) lozenge 3 mg  1 lozenge Oral PRN Sheldon Standing, MD       metoprolol  tartrate (LOPRESSOR ) injection 5 mg  5 mg Intravenous Q6H PRN Sheldon Standing, MD       morphine  (PF) 2 MG/ML injection 2 mg  2 mg Intravenous Q4H PRN Hall, Carole N, DO       multivitamins with iron  tablet 1 tablet  1 tablet Oral Daily Sheldon Standing, MD   1 tablet at 12/23/23 9082   ondansetron  (ZOFRAN ) injection 4 mg  4 mg Intravenous Q6H PRN Sheldon Standing, MD       oxyCODONE  (Oxy IR/ROXICODONE ) immediate release tablet 5 mg  5 mg Oral Q6H PRN Hall, Carole N, DO       phenol (CHLORASEPTIC) mouth spray 2 spray  2 spray Mouth/Throat PRN Sheldon Standing, MD       polycarbophil (FIBERCON) tablet 625 mg  625 mg Oral BID Sheldon Standing, MD   625 mg at 12/23/23 [phone]  polyethylene glycol (MIRALAX  / GLYCOLAX ) packet 17 g  17 g Oral Daily Sheldon Standing, MD   17 g at 12/22/23 2141   polyethylene glycol (MIRALAX  / GLYCOLAX ) packet 17 g  17 g Oral Daily PRN Shona Terry SAILOR, DO       potassium chloride  SA (KLOR-CON  M) CR tablet 40 mEq  40 mEq Oral Daily Sheldon Standing, MD   40 mEq at 12/23/23 9082   simethicone  (MYLICON) 40 MG/0.6ML suspension 80 mg  80 mg Oral QID PRN Sheldon Standing, MD       thiamine  (VITAMIN B1) tablet 100 mg  100 mg Oral Daily Shona Terry N, DO   100 mg at 12/23/23 9082   Or   thiamine  (VITAMIN B1) injection 100 mg  100 mg Intravenous Daily Shona Terry N, DO        Allergies as of 12/22/2023   (No Known Allergies)    Family History  Problem Relation Age of Onset   Cancer Neg Hx    Heart disease Neg Hx    Hyperlipidemia Neg Hx    Hypertension Neg Hx    Kidney disease Neg Hx    Stroke Neg Hx     Social History   Socioeconomic History   Marital status: Married    Spouse name: Not on file   Number of children: 0   Years of education: 12    Highest education level: Not on file  Occupational History   Occupation: Architect: [employer]  Tobacco Use   Smoking status: Every Day    Current packs/day: 0.50    Types: Cigarettes   Smokeless tobacco: Never  Substance and Sexual Activity   Alcohol use: Yes    Alcohol/week: 100.0 standard drinks of alcohol    Types: 100 Shots of liquor per week    Comment: Drinks 1 pint of 80 proof whiskey a night (16 oz)   Drug use: No   Sexual activity: Not on file  Other Topics Concern   Not on file  Social History Narrative   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 [name] ([employer]), has been there for 26 years ('13). Wife married in '07, marriage in good health. SO: torticollis, operation '10 to remove 3 discs   Social Drivers of Corporate investment banker Strain: Not on file  Food Insecurity: Not on file  Transportation Needs: Not on file  Physical Activity: Not on file  Stress: Not on file  Social Connections: Not on file  Intimate Partner Violence: Not on file    Review of Systems: As per HPI, all others negative  Physical Exam: Vital signs in last 24 hours: Temp:  [98.2 F (36.8 C)-99.2 F (37.3 C)] 98.2 F (36.8 C) (08/09 0420) Pulse Rate:  [73-87] 73 (08/09 0420) Resp:  [15-18] 18 (08/09 0420) BP: (126-152)/(84-103) 138/96 (08/09 0420) SpO2:  [95 %-100 %] 96 % (08/09 0420) Weight:  [77.2 kg] 77.2 kg (08/08 2320) Last BM Date : 12/23/23 General:   Alert,  Well-developed, well-nourished, pleasant and cooperative in NAD Head:  Normocephalic and atraumatic. Eyes:  Sclera clear, no icterus.   Conjunctiva pink. Ears:  Normal auditory acuity. Nose:  No deformity, discharge,  or lesions. Mouth:  No deformity or lesions.  Oropharynx pink & moist. Neck:  Supple; no masses or thyromegaly. Lungs:  No visible distress Abdomen:  Soft, nontender and nondistended. No masses, hepatosplenomegaly or hernias noted. Normal bowel  sounds, without  guarding, and without rebound.     Msk:  Symmetrical without gross deformities. Normal posture. Pulses:  Normal pulses noted. Extremities:  Without clubbing or edema. Neurologic:  Alert and  oriented x4;  grossly normal neurologically. Skin:  Intact without significant lesions or rashes. Psych:  Alert and cooperative. Normal mood and affect.   Lab Results: Recent Labs    12/22/23 1514  WBC 6.9  HGB 6.0*  HCT 21.3*  PLT 476*   BMET Recent Labs    12/22/23 1514  NA 135  K 3.2*  CL 101  CO2 23  GLUCOSE 98  BUN 10  CREATININE 0.69  CALCIUM  8.5*   LFT Recent Labs    12/22/23 1514  PROT 7.2  ALBUMIN 2.5*  AST 27  ALT 12  ALKPHOS 91  BILITOT 0.5   PT/INR No results for input(s): LABPROT, INR in the last 72 hours.  Studies/Results: CT ABDOMEN PELVIS W CONTRAST Result Date: 12/22/2023 CLINICAL DATA:  Left lower quadrant pain and anemia. * Tracking Code: BO * EXAM: CT ABDOMEN AND PELVIS WITH CONTRAST TECHNIQUE: Multidetector CT imaging of the abdomen and pelvis was performed using the standard protocol following bolus administration of intravenous contrast. RADIATION DOSE REDUCTION: This exam was performed according to the departmental dose-optimization program which includes automated exposure control, adjustment of the mA and/or kV according to patient size and/or use of iterative reconstruction technique. CONTRAST:  OMNIPAQUE  IOHEXOL  300 MG/ML  SOLN COMPARISON:  01/14/2005 FINDINGS: Lower chest: Lingular scarring. Normal heart size without pericardial or pleural effusion. Three vessel coronary artery calcification. Hepatobiliary: Mild caudate lobe enlargement. No suspicious liver lesion. Borderline gallbladder distension without calcified stone or pericholecystic edema. No biliary duct dilatation. Pancreas: Normal, without mass or ductal dilatation. Spleen: Normal in size, without focal abnormality. Adrenals/Urinary Tract: Normal adrenal glands.  Bilateral extrarenal pelvis. Although there is no significant upstream dilatation, dominant stone in the left renal pelvis measures 2.1 by 3.8 cm including on 32/3. Normal urinary bladder. Stomach/Bowel: Normal stomach, without wall thickening. Involving the majority of the ascending colon is a large mass including at up to 9.8 x 8.6 x 10.3 cm. Normal terminal ileum. A diminutive appendix is likely identified on 59/2. No bowel obstruction. Vascular/Lymphatic: Aortic atherosclerosis. No abdominopelvic adenopathy. Reproductive: Normal prostate. Other: Tiny fat containing right inguinal hernia. No significant free fluid. No free intraperitoneal air. No evidence of omental or peritoneal disease. Musculoskeletal: No acute osseous abnormality. IMPRESSION: 1. Large (10.3 cm maximally) mass centered about the ascending colon. 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 Given large size and lack of bowel obstruction, less likely differential considerations including lymphoma are possible. 2. No evidence of metastatic disease 3. Dominant 3.8 cm stone within a left extrarenal pelvis without significant obstruction. 4. Coronary artery atherosclerosis. Aortic Atherosclerosis (ICD10-I70.0). 5. Mild caudate lobe enlargement is new since 2006. Given the EMR history of 100 standard drinks of alcohol per week early cirrhosis is a concern. Report called to Dr. Lynwood at 5:55 p.m. Electronically Signed   By: Rockey Kilts M.D.   On: 12/22/2023 18:26   DG Chest 2 View Result Date: 12/22/2023 CLINICAL DATA:  Lower abdominal pain for the past week. Some nausea. Smoker. EXAM: CHEST - 2 VIEW COMPARISON:  04/17/2012 FINDINGS: Normal-sized heart. Clear lungs with normal vascularity. The lungs remain hyperexpanded with flattening of the hemidiaphragms on the lateral view. Stable mild central peribronchial thickening. Mild thoracic spine degenerative changes. IMPRESSION: 1. No acute abnormality. 2. Stable changes of COPD and chronic bronchitis. Electronically  Signed  By: Elspeth Bathe M.D.   On: 12/22/2023 17:02    Impression:   Abdominal pain. Weight loss. Abnormal imaging:  Suspected mass ascending colon.  Caudate lobe enlargement.  Plan:   Clear liquid diet, NPO after midnight. Colonoscopy for further evaluation tomorrow. I am not convinced patient has cirrhosis of liver, but this can't be excluded; in any event, there is no imaging of portal hypertension or anything that would impart prohibitive operative risk. Risks (bleeding, infection, bowel perforation that could require surgery, sedation-related changes in cardiopulmonary systems), benefits (identification and possible treatment of source of symptoms, exclusion of certain causes of symptoms), and alternatives (watchful waiting, radiographic imaging studies, empiric medical treatment) of colonoscopy were explained to patient/family in detail and patient wishes to proceed.  [facility] will follow.   LOS: 1 day   Carla Whilden M  12/23/2023, 9:43 AM  Cell [phone] If no answer or after 5 PM call [phone]

## 2023-12-24 ENCOUNTER — Encounter (HOSPITAL_COMMUNITY)
Admission: EM | Disposition: A | Discharge status: Home / Self Care | Payer: Self-pay | Source: Home / Self Care | Attending: Internal Medicine

## 2023-12-24 ENCOUNTER — Inpatient Hospital Stay (HOSPITAL_COMMUNITY): Payer: Self-pay | Admitting: Anesthesiology

## 2023-12-24 ENCOUNTER — Encounter (HOSPITAL_COMMUNITY): Payer: Self-pay | Admitting: Anesthesiology

## 2023-12-24 ENCOUNTER — Encounter (HOSPITAL_COMMUNITY): Payer: Self-pay | Admitting: Internal Medicine

## 2023-12-24 DIAGNOSIS — K529 Noninfective gastroenteritis and colitis, unspecified: Secondary | ICD-10-CM

## 2023-12-24 DIAGNOSIS — D123 Benign neoplasm of transverse colon: Secondary | ICD-10-CM

## 2023-12-24 DIAGNOSIS — F17200 Nicotine dependence, unspecified, uncomplicated: Secondary | ICD-10-CM

## 2023-12-24 DIAGNOSIS — I10 Essential (primary) hypertension: Secondary | ICD-10-CM

## 2023-12-24 DIAGNOSIS — K648 Other hemorrhoids: Secondary | ICD-10-CM

## 2023-12-24 HISTORY — PX: COLONOSCOPY: SHX5424

## 2023-12-24 LAB — COMPREHENSIVE METABOLIC PANEL WITH GFR
ALT: 10 U/L (ref 0–44)
AST: 27 U/L (ref 15–41)
Albumin: 2.4 g/dL — ABNORMAL LOW (ref 3.5–5.0)
Alkaline Phosphatase: 81 U/L (ref 38–126)
Anion gap: 8 (ref 5–15)
BUN: 5 mg/dL — ABNORMAL LOW (ref 6–20)
CO2: 25 mmol/L (ref 22–32)
Calcium: 8.5 mg/dL — ABNORMAL LOW (ref 8.9–10.3)
Chloride: 104 mmol/L (ref 98–111)
Creatinine, Ser: 0.69 mg/dL (ref 0.61–1.24)
GFR, Estimated: >60 mL/min (ref >60)
Glucose, Bld: 87 mg/dL (ref 70–99)
Potassium: 4.1 mmol/L (ref 3.5–5.1)
Sodium: 137 mmol/L (ref 135–145)
Total Bilirubin: 0.6 mg/dL (ref 0.0–1.2)
Total Protein: 6.7 g/dL (ref 6.5–8.1)

## 2023-12-24 LAB — CBC
HCT: 26.7 % — ABNORMAL LOW (ref 39.0–52.0)
Hemoglobin: 7.6 g/dL — ABNORMAL LOW (ref 13.0–17.0)
MCH: 22.7 pg — ABNORMAL LOW (ref 26.0–34.0)
MCHC: 28.5 g/dL — ABNORMAL LOW (ref 30.0–36.0)
MCV: 79.7 fL — ABNORMAL LOW (ref 80.0–100.0)
Platelets: 447 K/uL — ABNORMAL HIGH (ref 150–400)
RBC: 3.35 MIL/uL — ABNORMAL LOW (ref 4.22–5.81)
RDW: 19.7 % — ABNORMAL HIGH (ref 11.5–15.5)
WBC: 5.4 K/uL (ref 4.0–10.5)
nRBC: 0 % (ref 0.0–0.2)

## 2023-12-24 SURGERY — COLONOSCOPY
Anesthesia: Monitor Anesthesia Care | Laterality: Left

## 2023-12-24 MED ORDER — PROPOFOL 10 MG/ML IV BOLUS
INTRAVENOUS | Status: DC | PRN
Start: 2023-12-24 — End: 2023-12-24
  Administered 2023-12-24: 70 mg via INTRAVENOUS

## 2023-12-24 MED ORDER — NICOTINE 21 MG/24HR TD PT24
21.0000 mg | MEDICATED_PATCH | Freq: Every day | TRANSDERMAL | Status: DC
  Administered 2023-12-24 – 2024-01-02 (×11): 21 mg via TRANSDERMAL
  Filled 2023-12-24 (×10): qty 1

## 2023-12-24 MED ORDER — MIDAZOLAM HCL 5 MG/5ML IJ SOLN
INTRAMUSCULAR | Status: DC | PRN
  Administered 2023-12-24: 2 mg via INTRAVENOUS

## 2023-12-24 MED ORDER — SPOT INK MARKER SYRINGE KIT
PACK | SUBMUCOSAL | Status: DC | PRN
  Administered 2023-12-24: 2 mL via SUBMUCOSAL

## 2023-12-24 MED ORDER — METOCLOPRAMIDE HCL 5 MG/ML IJ SOLN: 5.0000 mg | Freq: Three times a day (TID) | INTRAMUSCULAR | Status: DC | PRN

## 2023-12-24 MED ORDER — MIDAZOLAM HCL 2 MG/2ML IJ SOLN
INTRAMUSCULAR | Status: AC
  Filled 2023-12-24: qty 2

## 2023-12-24 MED ORDER — PROPOFOL 500 MG/50ML IV EMUL
INTRAVENOUS | Status: DC | PRN
  Administered 2023-12-24: 200 ug/kg/min via INTRAVENOUS

## 2023-12-24 MED ORDER — PROCHLORPERAZINE EDISYLATE 10 MG/2ML IJ SOLN
5.0000 mg | INTRAMUSCULAR | Status: DC | PRN
  Administered 2023-12-28: 10 mg via INTRAVENOUS
  Filled 2023-12-24: qty 2

## 2023-12-24 MED ORDER — ONDANSETRON HCL 4 MG/2ML IJ SOLN
4.0000 mg | Freq: Four times a day (QID) | INTRAMUSCULAR | Status: DC | PRN
  Administered 2023-12-28 – 2024-01-01 (×4): 4 mg via INTRAVENOUS
  Filled 2023-12-24 (×4): qty 2

## 2023-12-24 MED ORDER — PROPOFOL 500 MG/50ML IV EMUL
INTRAVENOUS | Status: AC
  Filled 2023-12-24: qty 100

## 2023-12-24 MED ORDER — PROPOFOL 500 MG/50ML IV EMUL
INTRAVENOUS | Status: AC
  Filled 2023-12-24: qty 50

## 2023-12-24 NOTE — Progress Notes (Signed)
 PROGRESS NOTE    Rodney [name]  FMW:[id number] DOB: Aug 17, 1960 DOA: 12/22/2023 PCP: Rollene Almarie LABOR, MD   Brief Narrative:  Rodney [name] is a 63 y.o. male with medical history significant for alcohol abuse, drinks 1 pint of whiskey daily(last drink just prior to admission), current tobacco abuse, 50-pack-year history, who presents to the ER with worsening abdominal pain noted to be markedly anemic with positive FOBT.  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  Hospitalist called for admission, general surgery and GI called in consult.  Assessment & Plan:   Principal Problem:   Colonic mass Active Problems:   Mass of hepatic flexure of colon   Anxiety   Alcohol abuse   Essential hypertension, benign   IDA (iron  deficiency anemia)   History of panic attacks   Inguinal hernia, right   Hypokalemia   Hypoalbuminemia  Acute symptomatic anemia Acute blood loss anemia on chronic iron  deficiency anemia versus chronic disease Thrombocytosis (? Reactive) Hemoglobin 6.0 with positive FOBT 2 unit PRBC 8/9 -repeat labs improving, 7.6 Likely benefit from iron  supplementation at discharge  Abdominal pain secondary to large colonic mass Noted on CT, differential adenocarcinoma, lymphoma General Surgery and GI consulted by EDP Endoscopy per GI 12/24/2023 -notable for multiple polyps measuring 6 to 15 mm (removed) - with noted partially obstructing tumor of the ascending colon biopsied  - Continue liquid diet per GI, advance as tolerated over the next 24 hours  Questionable cirrhosis Noted on CT, will need further testing to confirm  Appreciate GI recommendations   Alcohol abuse  High risk for alcohol withdrawal 1 pint liquor per day, last drink 12/22/2023 Patient is not interested in quitting. CIWA protocol in place -highest risk for withdrawals on 8/11, 8/12   Tobacco abuse 50-pack-year history Tobacco cessation  counseling done at bedside, the patient is not interested in quitting.  Coronary artery atherosclerosis.  Aortic atherosclerosis. Denies any anginal symptoms. Continue to monitor on telemetry   Dominant 3.8 cm stone within the left extra renal pelvis without significant obstruction, incidental finding.  Currently asymptomatic Continue IV fluid hydration   Moderate protein caloric malnutrition  Hypomagnesemia Hypokalemia Likely secondary to poor p.o. intake, abdominal mass/cancer and alcohol abuse  Follow repeat labs   DVT prophylaxis: SCDs Start: 12/22/23 1936 Code Status:   Code Status: Full Code Family Communication: None present  Status is: Inpatient  Dispo: The patient is from: Home              Anticipated d/c is to: Home              Anticipated d/c date is: 24-48 hours              Patient currently not medically stable for discharge  Consultants:  GI, general surgery  Procedures:  Endoscopy per GI tentatively planned 12/24/2023  Antimicrobials:  None  Subjective: No acute issues or events overnight tolerated endoscopy well denies nausea vomiting diarrhea constipation any fevers chills chest pain abdominal pain  Objective: Vitals:   12/23/23 1113 12/23/23 1301 12/23/23 2051 12/24/23 0510  BP: (!) 128/103 (!) 148/99 138/68 (!) 148/93  Pulse: 76 65 81 61  Resp: 16 16  16   Temp: 98.1 F (36.7 C) 98.2 F (36.8 C) 98 F (36.7 C) 98 F (36.7 C)  TempSrc: Oral Oral Oral Oral  SpO2: 99% 98% 99% 100%  Weight:      Height:  Intake/Output Summary (Last 24 hours) at 12/24/2023 0752 Last data filed at 12/24/2023 0420 Gross per 24 hour  Intake 1425.81 ml  Output --  Net 1425.81 ml   Filed Weights   12/22/23 2320  Weight: 77.2 kg    Examination:  General:  Pleasantly resting in bed, No acute distress. HEENT:  Normocephalic atraumatic.  Sclerae nonicteric, noninjected.  Extraocular movements intact bilaterally. Neck:  Without mass or deformity.   Trachea is midline. Lungs:  Clear to auscultate bilaterally without rhonchi, wheeze, or rales. Heart:  Regular rate and rhythm.  Without murmurs, rubs, or gallops. Abdomen:  Soft, nontender, nondistended.  Without guarding or rebound. Extremities: Without cyanosis, clubbing, edema, or obvious deformity. Skin:  Warm and dry, no erythema.  Data Reviewed: I have personally reviewed following labs and imaging studies  CBC: Recent Labs  Lab 12/22/23 1514 12/23/23 0711 12/23/23 1328 12/23/23 1542 12/24/23 0524  WBC 6.9 5.4  --   --  5.4  HGB 6.0* 6.7* 6.8* 7.0* 7.6*  HCT 21.3* 23.9* 24.3* 24.1* 26.7*  MCV 76.3* 78.4*  --   --  79.7*  PLT 476* 447*  --   --  447*   Basic Metabolic Panel: Recent Labs  Lab 12/22/23 1514 12/22/23 1846 12/24/23 0524  NA 135  --  137  K 3.2*  --  4.1  CL 101  --  104  CO2 23  --  25  GLUCOSE 98  --  87  BUN 10  --  5*  CREATININE 0.69  --  0.69  CALCIUM  8.5*  --  8.5*  MG  --  1.4*  --   PHOS  --  2.9  --    GFR: Estimated Creatinine Clearance: 98.2 mL/min (by C-G formula based on SCr of 0.69 mg/dL). Liver Function Tests: Recent Labs  Lab 12/22/23 1514 12/24/23 0524  AST 27 27  ALT 12 10  ALKPHOS 91 81  BILITOT 0.5 0.6  PROT 7.2 6.7  ALBUMIN 2.5* 2.4*   Recent Labs  Lab 12/22/23 1514  LIPASE 36   Anemia Panel: Recent Labs    12/22/23 1824 12/22/23 1930  VITAMINB12 1,045*  --   FOLATE 8.6  --   FERRITIN 4*  --   TIBC 308  --   IRON  11*  --   RETICCTPCT  --  2.2   Sepsis Labs: No results for input(s): PROCALCITON, LATICACIDVEN in the last 168 hours.  No results found for this or any previous visit (from the past 240 hours).   Radiology Studies: CT ABDOMEN PELVIS W CONTRAST Result Date: 12/22/2023 CLINICAL DATA:  Left lower quadrant pain and anemia. * Tracking Code: BO * EXAM: CT ABDOMEN AND PELVIS WITH CONTRAST TECHNIQUE: Multidetector CT imaging of the abdomen and pelvis was performed using the standard protocol  following bolus administration of intravenous contrast. RADIATION DOSE REDUCTION: This exam was performed according to the departmental dose-optimization program which includes automated exposure control, adjustment of the mA and/or kV according to patient size and/or use of iterative reconstruction technique. CONTRAST:  OMNIPAQUE  IOHEXOL  300 MG/ML  SOLN COMPARISON:  01/14/2005 FINDINGS: Lower chest: Lingular scarring. Normal heart size without pericardial or pleural effusion. Three vessel coronary artery calcification. Hepatobiliary: Mild caudate lobe enlargement. No suspicious liver lesion. Borderline gallbladder distension without calcified stone or pericholecystic edema. No biliary duct dilatation. Pancreas: Normal, without mass or ductal dilatation. Spleen: Normal in size, without focal abnormality. Adrenals/Urinary Tract: Normal adrenal glands. Bilateral extrarenal pelvis. Although there is no  significant upstream dilatation, dominant stone in the left renal pelvis measures 2.1 by 3.8 cm including on 32/3. Normal urinary bladder. Stomach/Bowel: Normal stomach, without wall thickening. Involving the majority of the ascending colon is a large mass including at up to 9.8 x 8.6 x 10.3 cm. Normal terminal ileum. A diminutive appendix is likely identified on 59/2. No bowel obstruction. Vascular/Lymphatic: Aortic atherosclerosis. No abdominopelvic adenopathy. Reproductive: Normal prostate. Other: Tiny fat containing right inguinal hernia. No significant free fluid. No free intraperitoneal air. No evidence of omental or peritoneal disease. Musculoskeletal: No acute osseous abnormality. IMPRESSION: 1. Large (10.3 cm maximally) mass centered about the ascending colon. 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 Given large size and lack of bowel obstruction, less likely differential considerations including lymphoma are possible. 2. No evidence of metastatic disease 3. Dominant 3.8 cm stone within a left extrarenal pelvis without  significant obstruction. 4. Coronary artery atherosclerosis. Aortic Atherosclerosis (ICD10-I70.0). 5. Mild caudate lobe enlargement is new since 2006. Given the EMR history of 100 standard drinks of alcohol per week early cirrhosis is a concern. Report called to Dr. Lynwood at 5:55 p.m. Electronically Signed   By: Rockey Kilts M.D.   On: 12/22/2023 18:26   DG Chest 2 View Result Date: 12/22/2023 CLINICAL DATA:  Lower abdominal pain for the past week. Some nausea. Smoker. EXAM: CHEST - 2 VIEW COMPARISON:  04/17/2012 FINDINGS: Normal-sized heart. Clear lungs with normal vascularity. The lungs remain hyperexpanded with flattening of the hemidiaphragms on the lateral view. Stable mild central peribronchial thickening. Mild thoracic spine degenerative changes. IMPRESSION: 1. No acute abnormality. 2. Stable changes of COPD and chronic bronchitis. Electronically Signed   By: Elspeth Bathe M.D.   On: 12/22/2023 17:02   Scheduled Meds:  sodium chloride    Intravenous Once   bisacodyl   10 mg Rectal Once   folic acid   1 mg Oral Daily   multivitamins with iron   1 tablet Oral Daily   polycarbophil  625 mg Oral BID   potassium chloride   40 mEq Oral Daily   thiamine   100 mg Oral Daily   Or   thiamine   100 mg Intravenous Daily   Continuous Infusions:  lactated ringers      lactated ringers  50 mL/hr at 12/24/23 0420     LOS: 2 days   Time spent:  Elsie JAYSON Montclair, DO [facility]  If 7PM-7AM, please contact night-coverage [email or web address]  12/24/2023, 7:52 AM

## 2023-12-24 NOTE — Anesthesia Preprocedure Evaluation (Addendum)
 Anesthesia Evaluation  Patient identified by MRN, date of birth, ID band Patient awake    Reviewed: Allergy & Precautions, NPO status , Patient's Chart, lab work & pertinent test results  Airway Mallampati: II  TM Distance: >3 FB Neck ROM: Full    Dental  (+) Partial Upper   Pulmonary Current Smoker and Patient abstained from smoking.   Pulmonary exam normal        Cardiovascular hypertension, Normal cardiovascular exam     Neuro/Psych  PSYCHIATRIC DISORDERS Anxiety     negative neurological ROS     GI/Hepatic negative GI ROS, Bowel prep,,,(+)     substance abuse  alcohol use  Endo/Other  negative endocrine ROS    Renal/GU Renal disease     Musculoskeletal negative musculoskeletal ROS (+)    Abdominal   Peds  Hematology  (+) Blood dyscrasia, anemia   Anesthesia Other Findings iron  deficiency anemia colon mass on CT  Reproductive/Obstetrics                              Anesthesia Physical Anesthesia Plan  ASA: 3  Anesthesia Plan: MAC   Post-op Pain Management:    Induction:   PONV Risk Score and Plan: 0 and Propofol  infusion and Treatment may vary due to age or medical condition  Airway Management Planned: Simple Face Mask  Additional Equipment:   Intra-op Plan:   Post-operative Plan:   Informed Consent: I have reviewed the patients History and Physical, chart, labs and discussed the procedure including the risks, benefits and alternatives for the proposed anesthesia with the patient or authorized representative who has indicated his/her understanding and acceptance.     Dental advisory given  Plan Discussed with: CRNA  Anesthesia Plan Comments:          Anesthesia Quick Evaluation

## 2023-12-24 NOTE — Op Note (Signed)
 [facility] Patient Name: Rodney [name] Procedure Date: 12/24/2023 MRN: [id number] Attending MD: Elsie Cree , MD, [id number] Date of Birth: Dec 28, 1960 CSN: [id number] Age: 63 Admit Type: Inpatient Procedure:                Colonoscopy Indications:              Lower abdominal pain, Chronic diarrhea, Abnormal CT                            of the GI tract Providers:                Elsie Cree, MD, Robie Breed, RN,                            Fairy Marina, Technician Referring MD:             [facility] Medicines:                Monitored Anesthesia Care Complications:            No immediate complications. Estimated Blood Loss:     Estimated blood loss: none. Estimated blood loss:                            none. Procedure:                Pre-Anesthesia Assessment:                           - Prior to the procedure, a History and Physical                            was performed, and patient medications and                            allergies were reviewed. The patient's tolerance of                            previous anesthesia was also reviewed. The risks                            and benefits of the procedure and the sedation                            options and risks were discussed with the patient.                            All questions were answered, and informed consent                            was obtained. Prior Anticoagulants: The patient has                            taken no anticoagulant or antiplatelet agents. ASA                            Grade Assessment: III -  A patient with severe                            systemic disease. After reviewing the risks and                            benefits, the patient was deemed in satisfactory                            condition to undergo the procedure.                           After obtaining informed consent, the colonoscope                            was passed under  direct vision. Throughout the                            procedure, the patient's blood pressure, pulse, and                            oxygen saturations were monitored continuously. The                            [id number]-[id number] ([id number]) olympus colonscope was                            introduced through the anus with the intention of                            advancing to the cecum. The scope was advanced to                            the ascending colon before the procedure was                            aborted. Medications were given. The colonoscopy                            was performed without difficulty. The patient                            tolerated the procedure well. The quality of the                            bowel preparation was fair. The rectum was                            photographed. Scope In: 8:28:50 AM Scope Out: 8:57:26 AM Total Procedure Duration: 0 hours 28 minutes 36 seconds  Findings:      Hemorrhoids were found on perianal exam.      Bowel prep diffusely fair; semisolid and viscous stool obscured some       views throughout colon; diminutive or subtle sessile polyps could easily  have been missed.      A 10 mm polyp was found in the descending colon. The polyp was       semi-pedunculated. The polyp was removed with a hot snare. Resection and       retrieval were complete.      Three sessile polyps were found in the transverse colon. The polyps were       6 to 15 mm in size. These polyps were removed with a hot snare.       Resection and retrieval were complete.      A frond-like/villous, fungating, infiltrative and ulcerated partially       obstructing large mass was found in the ascending colon. The mass was       partially circumferential (involving two-thirds of the lumen       circumference). Lesion several centimeters in length, I could not locate       the ileocecal valve or cecum. No bleeding was present. This was biopsied       with a  cold forceps for histology. Area was tattooed with an injection       of 2 mL of Spot (carbon black).      Internal hemorrhoids were found during retroflexion. The hemorrhoids       were moderate.      The exam was otherwise without abnormality on direct and retroflexion       views. Impression:               - Preparation of the colon was fair.                           - Hemorrhoids found on perianal exam.                           - One 10 mm polyp in the descending colon, removed                            with a hot snare. Resected and retrieved.                           - Three 6 to 15 mm polyps in the transverse colon,                            removed with a hot snare. Resected and retrieved.                           - Rule out malignancy, partially obstructing tumor                            in the ascending colon. Biopsied. Tattooed.                           - The examination was otherwise normal on direct                            and retroflexion views. Moderate Sedation:      None Recommendation:           - Return patient to hospital ward for ongoing  care.                           - Clear liquid diet today.                           - Continue present medications.                           - Await pathology results.                           [facility] will follow. Procedure Code(s):        --- Professional ---                           [phone], 52, Colonoscopy, flexible; with removal of                            tumor(s), polyp(s), or other lesion(s) by snare                            technique                           45380, 59,52, Colonoscopy, flexible; with biopsy,                            single or multiple                           45381, 52, Colonoscopy, flexible; with directed                            submucosal injection(s), any substance Diagnosis Code(s):        --- Professional ---                           K64.9, Unspecified hemorrhoids                            D12.4, Benign neoplasm of descending colon                           D12.3, Benign neoplasm of transverse colon (hepatic                            flexure or splenic flexure)                           D49.0, Neoplasm of unspecified behavior of                            digestive system                           K56.690, Other partial intestinal obstruction  R10.30, Lower abdominal pain, unspecified                           K52.9, Noninfective gastroenteritis and colitis,                            unspecified                           R93.3, Abnormal findings on diagnostic imaging of                            other parts of digestive tract CPT copyright 2022 American Medical Association. All rights reserved. The codes documented in this report are preliminary and upon coder review may  be revised to meet current compliance requirements. Elsie Cree, MD 12/24/2023 9:10:46 AM This report has been signed electronically. Number of Addenda: 0

## 2023-12-24 NOTE — Plan of Care (Signed)

## 2023-12-24 NOTE — Anesthesia Postprocedure Evaluation (Signed)
 Anesthesia Post Note  Patient: AVEER BARTOW  Procedure(s) Performed: COLONOSCOPY (Left)     Patient location during evaluation: PACU Anesthesia Type: MAC Level of consciousness: awake Pain management: pain level controlled Vital Signs Assessment: post-procedure vital signs reviewed and stable Respiratory status: spontaneous breathing, nonlabored ventilation and respiratory function stable Cardiovascular status: blood pressure returned to baseline and stable Postop Assessment: no apparent nausea or vomiting Anesthetic complications: no   No notable events documented.  Last Vitals:  Vitals:   12/24/23 0930 12/24/23 1318  BP: (!) 163/100 (!) 151/95  Pulse: 67 73  Resp: 20 16  Temp: (!) 36.3 C 36.9 C  SpO2: 97% 97%    Last Pain:  Vitals:   12/24/23 1318  TempSrc: Oral  PainSc:                  Kimball Appleby P Ruthvik Barnaby

## 2023-12-24 NOTE — Plan of Care (Signed)
  Problem: Education: Goal: Knowledge of General Education information will improve Description: Including pain rating scale, medication(s)/side effects and non-pharmacologic comfort measures Outcome: Progressing   Problem: Health Behavior/Discharge Planning: Goal: Ability to manage health-related needs will improve Outcome: Progressing   Problem: Activity: Goal: Risk for activity intolerance will decrease Outcome: Progressing   Problem: Nutrition: Goal: Adequate nutrition will be maintained Outcome: Progressing   Problem: Elimination: Goal: Will not experience complications related to bowel motility Outcome: Progressing Goal: Will not experience complications related to urinary retention Outcome: Progressing   Problem: Pain Managment: Goal: General experience of comfort will improve and/or be controlled Outcome: Progressing   Problem: Safety: Goal: Ability to remain free from injury will improve Outcome: Progressing

## 2023-12-24 NOTE — Interval H&P Note (Signed)
 History and Physical Interval Note:  12/24/2023 8:12 AM  Rodney [name]  has presented today for surgery, with the diagnosis of iron  deficiency anemia, colon mass on CT.  The various methods of treatment have been discussed with the patient and family. After consideration of risks, benefits and other options for treatment, the patient has consented to  Procedure(s): COLONOSCOPY (Left) as a surgical intervention.  The patient's history has been reviewed, patient examined, no change in status, stable for surgery.  I have reviewed the patient's chart and labs.  Questions were answered to the patient's satisfaction.     [name]

## 2023-12-24 NOTE — Transfer of Care (Signed)
 Immediate Anesthesia Transfer of Care Note  Patient: Rodney [name]  Procedure(s) Performed: COLONOSCOPY (Left)  Patient Location: PACU  Anesthesia Type:MAC  Level of Consciousness: awake, alert , and oriented  Airway & Oxygen Therapy: Patient Spontanous Breathing  Post-op Assessment: Report given to RN and Post -op Vital signs reviewed and stable  Post vital signs: Reviewed and stable  Last Vitals:  Vitals Value Taken Time  BP 163/100 12/24/23 09:31  Temp 36.3 C 12/24/23 09:30  Pulse 63 12/24/23 09:32  Resp 24 12/24/23 09:32  SpO2 100 % 12/24/23 09:32  Vitals shown include unfiled device data.  Last Pain:  Vitals:   12/24/23 0930  TempSrc:   PainSc: 0-No pain         Complications: No notable events documented.

## 2023-12-25 LAB — BPAM RBC
Blood Product Expiration Date: 202509092359
Blood Product Expiration Date: 202509092359
ISSUE DATE / TIME: 202508081951
ISSUE DATE / TIME: 202508091047
Unit Type and Rh: 5100
Unit Type and Rh: 5100

## 2023-12-25 LAB — COMPREHENSIVE METABOLIC PANEL WITH GFR
ALT: 11 U/L (ref 0–44)
AST: 29 U/L (ref 15–41)
Albumin: 2.2 g/dL — ABNORMAL LOW (ref 3.5–5.0)
Alkaline Phosphatase: 78 U/L (ref 38–126)
Anion gap: 8 (ref 5–15)
BUN: <5 mg/dL — ABNORMAL LOW (ref 6–20)
CO2: 25 mmol/L (ref 22–32)
Calcium: 8.6 mg/dL — ABNORMAL LOW (ref 8.9–10.3)
Chloride: 106 mmol/L (ref 98–111)
Creatinine, Ser: 0.68 mg/dL (ref 0.61–1.24)
GFR, Estimated: >60 mL/min (ref >60)
Glucose, Bld: 87 mg/dL (ref 70–99)
Potassium: 3.6 mmol/L (ref 3.5–5.1)
Sodium: 139 mmol/L (ref 135–145)
Total Bilirubin: 0.4 mg/dL (ref 0.0–1.2)
Total Protein: 6.4 g/dL — ABNORMAL LOW (ref 6.5–8.1)

## 2023-12-25 LAB — TYPE AND SCREEN
ABO/RH(D): O POS
Antibody Screen: NEGATIVE
Unit division: 0
Unit division: 0

## 2023-12-25 LAB — CBC
HCT: 26 % — ABNORMAL LOW (ref 39.0–52.0)
Hemoglobin: 7.4 g/dL — ABNORMAL LOW (ref 13.0–17.0)
MCH: 22.4 pg — ABNORMAL LOW (ref 26.0–34.0)
MCHC: 28.5 g/dL — ABNORMAL LOW (ref 30.0–36.0)
MCV: 78.8 fL — ABNORMAL LOW (ref 80.0–100.0)
Platelets: 460 K/uL — ABNORMAL HIGH (ref 150–400)
RBC: 3.3 MIL/uL — ABNORMAL LOW (ref 4.22–5.81)
RDW: 20.5 % — ABNORMAL HIGH (ref 11.5–15.5)
WBC: 5.9 K/uL (ref 4.0–10.5)
nRBC: 0 % (ref 0.0–0.2)

## 2023-12-25 LAB — CEA: CEA: 105 ng/mL — ABNORMAL HIGH (ref 0.0–4.7)

## 2023-12-25 MED ORDER — ENSURE PLUS HIGH PROTEIN PO LIQD
237.0000 mL | Freq: Two times a day (BID) | ORAL | Status: DC
  Administered 2023-12-25 – 2023-12-26 (×6): 237 mL via ORAL

## 2023-12-25 NOTE — Progress Notes (Signed)
 PROGRESS NOTE    Rodney [name]  FMW:[id number] DOB: 1961-01-22 DOA: 12/22/2023 PCP: Rollene Almarie LABOR, MD   Brief Narrative:  Rodney [name] is a 63 y.o. male with medical history significant for alcohol abuse, drinks 1 pint of whiskey daily(last drink just prior to admission), current tobacco abuse, 50-pack-year history, who presents to the ER with worsening abdominal pain noted to be markedly anemic with positive FOBT.  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  Hospitalist called for admission, general surgery and GI called in consult.  Assessment & Plan:   Principal Problem:   Colonic mass Active Problems:   Mass of hepatic flexure of colon   Anxiety   Alcohol abuse   Essential hypertension, benign   IDA (iron  deficiency anemia)   History of panic attacks   Inguinal hernia, right   Hypokalemia   Hypoalbuminemia  Acute symptomatic anemia Acute blood loss anemia on chronic iron  deficiency anemia versus chronic disease Thrombocytosis (? Reactive) Hemoglobin 6.0 with positive FOBT 2 unit PRBC 8/9 -repeat labs improving and stable 7.6/7.4 Likely benefit from iron  supplementation at discharge  Abdominal pain secondary to large colonic mass - Noted on CT, differential adenocarcinoma, lymphoma - General Surgery and GI consulted by EDP - Endoscopy per GI 12/24/2023 -notable for multiple polyps measuring 6 to 15 mm (removed) - with noted partially obstructing tumor of the ascending colon biopsied - Continue liquid diet per GI/Surgery - Surgery tentatively scheduled 12/27/23 for mass removal per general surgery.  Questionable cirrhosis Noted on CT, will need further testing to confirm  Appreciate GI recommendations   Alcohol abuse  High risk for alcohol withdrawal 1 pint liquor per day, last drink 12/22/2023 Patient is not interested in quitting. CIWA protocol in place -highest risk for withdrawals on 8/11,  8/12   Tobacco abuse 50-pack-year history Tobacco cessation counseling done at bedside, the patient is not interested in quitting.  Coronary artery atherosclerosis.  Aortic atherosclerosis. Denies any anginal symptoms. Continue to monitor on telemetry   Dominant 3.8 cm stone within the left extra renal pelvis without significant obstruction, incidental finding.  Currently asymptomatic Continue IV fluid hydration   Moderate protein caloric malnutrition  Hypomagnesemia Hypokalemia Likely secondary to poor p.o. intake, abdominal mass/cancer and alcohol abuse  Follow repeat labs   DVT prophylaxis: SCDs Start: 12/22/23 1936 Code Status:   Code Status: Full Code Family Communication: None present  Status is: Inpatient  Dispo: The patient is from: Home              Anticipated d/c is to: Home              Anticipated d/c date is: 24-48 hours              Patient currently not medically stable for discharge  Consultants:  GI, general surgery  Procedures:  Endoscopy per GI tentatively planned 12/24/2023  Antimicrobials:  None  Subjective: No acute issues or events overnight tolerated endoscopy well denies nausea vomiting diarrhea constipation any fevers chills chest pain abdominal pain  Objective: Vitals:   12/24/23 0930 12/24/23 1318 12/24/23 2050 12/25/23 0452  BP: (!) 163/100 (!) 151/95 (!) 146/100 (!) 142/97  Pulse: 67 73 71 71  Resp: 20 16 18 16   Temp: (!) 97.3 F (36.3 C) 98.5 F (36.9 C) 98.8 F (37.1 C) 98.5 F (36.9 C)  TempSrc:  Oral Oral Oral  SpO2: 97% 97% 96% 98%  Weight:  Height:        Intake/Output Summary (Last 24 hours) at 12/25/2023 0754 Last data filed at 12/24/2023 1519 Gross per 24 hour  Intake 872.19 ml  Output --  Net 872.19 ml   Filed Weights   12/22/23 2320  Weight: 77.2 kg    Examination:  General:  Pleasantly resting in bed, No acute distress. HEENT:  Normocephalic atraumatic.  Sclerae nonicteric, noninjected.   Extraocular movements intact bilaterally. Neck:  Without mass or deformity.  Trachea is midline. Lungs:  Clear to auscultate bilaterally without rhonchi, wheeze, or rales. Heart:  Regular rate and rhythm.  Without murmurs, rubs, or gallops. Abdomen:  Soft, nontender, nondistended.  Without guarding or rebound. Extremities: Without cyanosis, clubbing, edema, or obvious deformity. Skin:  Warm and dry, no erythema.  Data Reviewed: I have personally reviewed following labs and imaging studies  CBC: Recent Labs  Lab 12/22/23 1514 12/23/23 0711 12/23/23 1328 12/23/23 1542 12/24/23 0524 12/25/23 0533  WBC 6.9 5.4  --   --  5.4 5.9  HGB 6.0* 6.7* 6.8* 7.0* 7.6* 7.4*  HCT 21.3* 23.9* 24.3* 24.1* 26.7* 26.0*  MCV 76.3* 78.4*  --   --  79.7* 78.8*  PLT 476* 447*  --   --  447* 460*   Basic Metabolic Panel: Recent Labs  Lab 12/22/23 1514 12/22/23 1846 12/24/23 0524  NA 135  --  137  K 3.2*  --  4.1  CL 101  --  104  CO2 23  --  25  GLUCOSE 98  --  87  BUN 10  --  5*  CREATININE 0.69  --  0.69  CALCIUM  8.5*  --  8.5*  MG  --  1.4*  --   PHOS  --  2.9  --    GFR: Estimated Creatinine Clearance: 98.2 mL/min (by C-G formula based on SCr of 0.69 mg/dL). Liver Function Tests: Recent Labs  Lab 12/22/23 1514 12/24/23 0524  AST 27 27  ALT 12 10  ALKPHOS 91 81  BILITOT 0.5 0.6  PROT 7.2 6.7  ALBUMIN 2.5* 2.4*   Recent Labs  Lab 12/22/23 1514  LIPASE 36   Anemia Panel: Recent Labs    12/22/23 1824 12/22/23 1930  VITAMINB12 1,045*  --   FOLATE 8.6  --   FERRITIN 4*  --   TIBC 308  --   IRON  11*  --   RETICCTPCT  --  2.2   Sepsis Labs: No results for input(s): PROCALCITON, LATICACIDVEN in the last 168 hours.  No results found for this or any previous visit (from the past 240 hours).   Radiology Studies: No results found.  Scheduled Meds:  sodium chloride    Intravenous Once   bisacodyl   10 mg Rectal Once   folic acid   1 mg Oral Daily   multivitamins with  iron   1 tablet Oral Daily   nicotine   21 mg Transdermal Daily   polycarbophil  625 mg Oral BID   thiamine   100 mg Oral Daily   Or   thiamine   100 mg Intravenous Daily   Continuous Infusions:     LOS: 3 days   Time spent:  Elsie JAYSON Montclair, DO [facility]  If 7PM-7AM, please contact night-coverage [email or web address]  12/25/2023, 7:54 AM

## 2023-12-25 NOTE — Progress Notes (Signed)
 [facility] Progress Note  Rodney [name] 63 y.o. 06-24-1960  CC: Colonic mass   Subjective: Patient seen and examined at bedside.  Denies any acute overnight issues.  ROS : aFebrile, negative for chest pain   Objective: Vital signs in last 24 hours: Vitals:   12/24/23 2050 12/25/23 0452  BP: (!) 146/100 (!) 142/97  Pulse: 71 71  Resp: 18 16  Temp: 98.8 F (37.1 C) 98.5 F (36.9 C)  SpO2: 96% 98%    Physical Exam:  General:  Alert, cooperative, no distress, appears stated age  Head:  Normocephalic, without obvious abnormality, atraumatic  Eyes:  , EOM's intact,   Lungs:   No visible respiratory distress  Heart:  Regular rate and rhythm, S1, S2 normal  Abdomen:   Soft, non-tender, bowel sounds active all four quadrants,  no masses,   Extremities: Extremities normal, atraumatic, no  edema  Pulses: 2+ and symmetric    Lab Results: Recent Labs    12/22/23 1514 12/22/23 1846 12/24/23 0524  NA 135  --  137  K 3.2*  --  4.1  CL 101  --  104  CO2 23  --  25  GLUCOSE 98  --  87  BUN 10  --  5*  CREATININE 0.69  --  0.69  CALCIUM  8.5*  --  8.5*  MG  --  1.4*  --   PHOS  --  2.9  --    Recent Labs    12/22/23 1514 12/24/23 0524  AST 27 27  ALT 12 10  ALKPHOS 91 81  BILITOT 0.5 0.6  PROT 7.2 6.7  ALBUMIN 2.5* 2.4*   Recent Labs    12/24/23 0524 12/25/23 0533  WBC 5.4 5.9  HGB 7.6* 7.4*  HCT 26.7* 26.0*  MCV 79.7* 78.8*  PLT 447* 460*   No results for input(s): LABPROT, INR in the last 72 hours.    Assessment/Plan: -Ascending colon mass.  Status post colonoscopy with biopsy and tattoo placement yesterday 12/24/23.  Elevated CEA level concerning for underlying cancer.  He was also found to have few other colonic polyps. - Anemia.  Likely from above.  Recommendations --------------------------- - Biopsy pathology still pending. -Reviewed surgery consult note.  They are planning for inpatient surgical intervention given his  anemia. -Awaiting CT chest for staging - GI will follow periodically.   Layla Lah MD, FACP 12/25/2023, 7:35 AM  Contact #  [phone]

## 2023-12-25 NOTE — Plan of Care (Signed)

## 2023-12-25 NOTE — TOC Initial Note (Signed)
 Transition of Care [facility]) - Initial/Assessment Note    Patient Details  Name: Rodney [name] MRN: [id number] Date of Birth: 09/23/1960  [facility] CM/SW Contact:    Sheri ONEIDA Sharps, LCSW Phone Number: 12/25/2023, 10:37 AM  Clinical Narrative:                 Pt from home w/ spouse. Pt continues medical workup. TOC following for dc needs.     Barriers to Discharge: Continued Medical Work up   Patient Goals and CMS Choice Patient states their goals for this hospitalization and ongoing recovery are:: return home   Choice offered to / list presented to : NA      Expected Discharge Plan and Services In-house Referral: NA Discharge Planning Services: NA   Living arrangements for the past 2 months: Single Family Home                 DME Arranged: N/A DME Agency: NA       HH Arranged: NA HH Agency: NA        Prior Living Arrangements/Services Living arrangements for the past 2 months: Single Family Home Lives with:: Spouse Patient language and need for interpreter reviewed:: Yes Do you feel safe going back to the place where you live?: Yes      Need for Family Participation in Patient Care: Yes (Comment) Care giver support system in place?: Yes (comment)   Criminal Activity/Legal Involvement Pertinent to Current Situation/Hospitalization: No - Comment as needed  Activities of Daily Living   ADL Screening (condition at time of admission) Independently performs ADLs?: Yes (appropriate for developmental age) Is the patient deaf or have difficulty hearing?: No Does the patient have difficulty seeing, even when wearing glasses/contacts?: No Does the patient have difficulty concentrating, remembering, or making decisions?: No  Permission Sought/Granted                  Emotional Assessment Appearance:: Appears stated age Attitude/Demeanor/Rapport: Engaged Affect (typically observed): Accepting Orientation: : Oriented to Self, Oriented to Place,  Oriented to  Time, Oriented to Situation Alcohol / Substance Use: Not Applicable Psych Involvement: No (comment)  Admission diagnosis:  Lower GI bleed [K92.2] Colonic mass [K63.89] Anemia, unspecified type [D64.9] Patient Active Problem List   Diagnosis Date Noted   Mass of hepatic flexure of colon 12/22/2023   IDA (iron  deficiency anemia) 12/22/2023   History of panic attacks 12/22/2023   Inguinal hernia, right 12/22/2023   Hypokalemia 12/22/2023   Hypoalbuminemia 12/22/2023   Colonic mass 12/22/2023   Essential hypertension, benign 01/21/2014   Tobacco abuse 04/17/2012   Alcohol abuse 08/14/2008   Anxiety 05/05/2007   PANIC ATTACK 05/05/2007   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   DIVERTICULITIS, HX OF 05/05/2007   PCP:  Rollene Almarie LABOR, MD Pharmacy:   [facility] [phone] - [place], [place] - [address] AT [facility] [address] [place] [address] Phone: [phone] Fax: [phone]     Social Drivers of Health (SDOH) Social History: SDOH Screenings   Tobacco Use: High Risk (12/24/2023)   SDOH Interventions:     Readmission Risk Interventions    12/25/2023   10:36 AM  Readmission Risk Prevention Plan  Transportation Screening Complete  PCP or Specialist Appt within 5-7 Days Complete  Home Care Screening Complete  Medication Review (RN CM) Complete

## 2023-12-25 NOTE — Consult Note (Signed)
 WOC Nurse requested for preoperative stoma site marking  Discussed surgical procedure and stoma creation with patient. Explained role of the WOC nurse team.  Provided the patient with educational booklet and provided samples of pouching options.    Examined patient lying, sitting, and standing in order to place the marking in the patient's visual field, away from any creases or abdominal contour issues and within the rectus muscle.      Marked for ileostomy in the RLQ  _5___cm to the right of the umbilicus and  __0.5__ cm above/below the umbilicus.   Patient's abdomen cleansed with CHG wipes at site markings, allowed to air dry prior to marking.Covered mark with thin film transparent dressing to preserve mark until date of surgery.   WOC Nurse team will follow up with patient after surgery for continue ostomy care and teaching.  Virdell Hoiland [facility] MSN, RN,CWOCN, CNS, [employer] [phone]

## 2023-12-25 NOTE — Progress Notes (Signed)
 Initial Nutrition Assessment  DOCUMENTATION CODES:   Non-severe (moderate) malnutrition in context of chronic illness  INTERVENTION:   -Ensure Plus High Protein po BID, each supplement provides 350 kcal and 20 grams of protein.   -Continue vitamin supplements: MVI, Folic acid , Thiamine   NUTRITION DIAGNOSIS:   Moderate Malnutrition related to chronic illness as evidenced by mild fat depletion, mild muscle depletion.  GOAL:   Patient will meet greater than or equal to 90% of their needs  MONITOR:   PO intake, Supplement acceptance  REASON FOR ASSESSMENT:   Consult Wound healing, Poor PO  ASSESSMENT:   63 y.o. male with medical history significant for alcohol abuse, drinks 1 pint of whiskey daily(last drink just prior to admission), current tobacco abuse, 50-pack-year history, who presents to the ER with worsening abdominal pain noted to be markedly anemic with positive FOBT.  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  8/8: admitted 8/10: s/p colonoscopy  Patient in room, states he feels very hungry. Ate 2 bowls of chicken broth, choc pudding, choc ice cream and soda (~500 kcals,4g protein). Pt states he will order even more for lunch. Willing to try Ensure supplements for additional kcals and protein.  Pt states he was steadily eating less of his meals PTA as he was feeling full quickly. Meals would consist of meats, beans and potatoes. Not much of a vegetable eater. Per chart review, pt has also been consuming alcohol daily, whisky. On CIWA, receiving vitamin supplementation.  Per surgery note, plan is for colectomy later this week.   Per patient, UBW ~180 lbs. States his weight got down to 155-160 lbs at home.  Current weight here recorded as 170 lbs.  Medications: Folic acid , Multivitamin with minerals daily, Fibercon, Thiamine   Labs reviewed: Low iron    NUTRITION - FOCUSED PHYSICAL EXAM:  Flowsheet  Row Most Recent Value  Orbital Region Mild depletion  Upper Arm Region Moderate depletion  Thoracic and Lumbar Region No depletion  Buccal Region No depletion  Temple Region Mild depletion  Clavicle Bone Region No depletion  Clavicle and Acromion Bone Region No depletion  Scapular Bone Region No depletion  Dorsal Hand No depletion  Patellar Region Mild depletion  Anterior Thigh Region Mild depletion  Posterior Calf Region Mild depletion  Edema (RD Assessment) None  Hair Reviewed  Eyes Reviewed  Mouth Reviewed  Skin Reviewed  Nails Reviewed    Diet Order:   Diet Order             Diet full liquid Fluid consistency: Thin  Diet effective now                   EDUCATION NEEDS:   Education needs have been addressed  Skin:  Skin Assessment: Reviewed RN Assessment  Last BM:  8/10 -type 2  Height:   Ht Readings from Last 1 Encounters:  12/22/23 5' 9 (1.753 m)    Weight:   Wt Readings from Last 1 Encounters:  12/22/23 77.2 kg     BMI:  Body mass index is 25.13 kg/m.  Estimated Nutritional Needs:   Kcal:  2200-2400  Protein:  115-125g  Fluid:  2.2L/day   Morna Lee, MS, RD, LDN Inpatient Clinical Dietitian Contact via Secure chat

## 2023-12-25 NOTE — Progress Notes (Signed)
 1 Day Post-Op   Subjective/Chief Complaint: No complaints   Objective: Vital signs in last 24 hours: Temp:  [97.2 F (36.2 C)-98.8 F (37.1 C)] 98.5 F (36.9 C) (08/11 0452) Pulse Rate:  [67-76] 71 (08/11 0452) Resp:  [16-20] 16 (08/11 0452) BP: (116-163)/(84-100) 142/97 (08/11 0452) SpO2:  [96 %-98 %] 98 % (08/11 0452) Last BM Date : 12/24/23  Intake/Output from previous day: 08/10 0701 - 08/11 0700 In: 872.2 [P.O.:240; I.V.:632.2] Out: -  Intake/Output this shift: No intake/output data recorded.  Ab soft nontender nondistended  Lab Results:  Recent Labs    12/24/23 0524 12/25/23 0533  WBC 5.4 5.9  HGB 7.6* 7.4*  HCT 26.7* 26.0*  PLT 447* 460*   BMET Recent Labs    12/24/23 0524 12/25/23 0533  NA 137 139  K 4.1 3.6  CL 104 106  CO2 25 25  GLUCOSE 87 87  BUN 5* <5*  CREATININE 0.69 0.68  CALCIUM  8.5* 8.6*   PT/INR No results for input(s): LABPROT, INR in the last 72 hours. ABG No results for input(s): PHART, HCO3 in the last 72 hours.  Invalid input(s): PCO2, PO2  Studies/Results: No results found.  Anti-infectives: Anti-infectives (From admission, onward)    None       Assessment/Plan: Right colon mass  -biopsy pending, CEA elevated likely colon cancer - no evidence metastatic disease now but chest CT would be good idea -is going to need this resected I think due to anemia- discussed right colectomy open with possible ileostomy given his nutritional status -will plan for later this week -can have fulls for now  I reviewed Consultant GI notes, hospitalist notes, last 24 h vitals and pain scores, last 48 h intake and output, last 24 h labs and trends, and last 24 h imaging results.    Rodney [name] 12/25/2023

## 2023-12-26 ENCOUNTER — Encounter (HOSPITAL_COMMUNITY): Payer: Self-pay | Admitting: Gastroenterology

## 2023-12-26 ENCOUNTER — Inpatient Hospital Stay (HOSPITAL_COMMUNITY): Payer: MEDICAID

## 2023-12-26 DIAGNOSIS — D649 Anemia, unspecified: Secondary | ICD-10-CM

## 2023-12-26 DIAGNOSIS — C189 Malignant neoplasm of colon, unspecified: Secondary | ICD-10-CM

## 2023-12-26 DIAGNOSIS — E440 Moderate protein-calorie malnutrition: Secondary | ICD-10-CM | POA: Insufficient documentation

## 2023-12-26 LAB — CBC
HCT: 26.2 % — ABNORMAL LOW (ref 39.0–52.0)
Hemoglobin: 7.8 g/dL — ABNORMAL LOW (ref 13.0–17.0)
MCH: 23.2 pg — ABNORMAL LOW (ref 26.0–34.0)
MCHC: 29.8 g/dL — ABNORMAL LOW (ref 30.0–36.0)
MCV: 78 fL — ABNORMAL LOW (ref 80.0–100.0)
Platelets: 487 K/uL — ABNORMAL HIGH (ref 150–400)
RBC: 3.36 MIL/uL — ABNORMAL LOW (ref 4.22–5.81)
RDW: 21.2 % — ABNORMAL HIGH (ref 11.5–15.5)
WBC: 6.7 K/uL (ref 4.0–10.5)
nRBC: 0 % (ref 0.0–0.2)

## 2023-12-26 LAB — SURGICAL PATHOLOGY

## 2023-12-26 LAB — COMPREHENSIVE METABOLIC PANEL WITH GFR
ALT: 12 U/L (ref 0–44)
AST: 30 U/L (ref 15–41)
Albumin: 2.3 g/dL — ABNORMAL LOW (ref 3.5–5.0)
Alkaline Phosphatase: 81 U/L (ref 38–126)
Anion gap: 6 (ref 5–15)
BUN: 6 mg/dL (ref 6–20)
CO2: 26 mmol/L (ref 22–32)
Calcium: 8.9 mg/dL (ref 8.9–10.3)
Chloride: 103 mmol/L (ref 98–111)
Creatinine, Ser: 0.79 mg/dL (ref 0.61–1.24)
GFR, Estimated: >60 mL/min (ref >60)
Glucose, Bld: 99 mg/dL (ref 70–99)
Potassium: 4.1 mmol/L (ref 3.5–5.1)
Sodium: 135 mmol/L (ref 135–145)
Total Bilirubin: 0.5 mg/dL (ref 0.0–1.2)
Total Protein: 6.6 g/dL (ref 6.5–8.1)

## 2023-12-26 LAB — PROTIME-INR
INR: 1.1 (ref 0.8–1.2)
Prothrombin Time: 14.9 s (ref 11.4–15.2)

## 2023-12-26 NOTE — Hospital Course (Addendum)
 Rodney [name] is a 63 y.o. male with medical history significant for alcohol abuse, drinks 1 pint of whiskey daily(last drink just prior to admission), current tobacco abuse, 50-pack-year history, who presented to the ER with worsening abdominal pain noted to be markedly anemic with positive FOBT.  CT imaging confirms large 10.3 cm mas adjacent to ascending colon.  Hospitalist called for admission, general surgery and GI called in consult.   Assessment & Plan:   Colonic adenocarcinoma Abdominal pain -10.3 cm mass adjacent to the ascending colon - s/p colonoscopy on 12/24/2023 with biopsies obtained; pathology has returned with invasive moderately differentiated adenocarcinoma - General Surgery following - Underwent right colectomy on 12/27/2023 - Also discussed with oncology; recommended for outpatient referral, they will set up appointment - No signs of metastatic disease on CT chest -Awaiting return of bowel function and diet advancement prior to discharge; CLD started 8/14 (tolerating)  Acute symptomatic anemia Acute blood loss anemia on chronic iron  deficiency anemia versus chronic disease Thrombocytosis (? Reactive) Hemoglobin 6 g/dL initially with positive FOBT - Transfusing as needed - Currently hemoglobin stable   Questionable cirrhosis Noted on CT, will need further testing to confirm  Appreciate GI recommendations   Alcohol abuse  1 pint liquor per day, last drink 12/22/2023 - no s/s w/d at this time; can d/c CIWA   Tobacco abuse 50-pack-year history Tobacco cessation counseling done at bedside; he's more interested in cessation at this time   Coronary artery atherosclerosis.  Aortic atherosclerosis. Denies any anginal symptoms. Continue to monitor on telemetry   Nephrolithiasis - Dominant 3.8 cm stone within the left extra renal pelvis without significant obstruction, incidental finding.  Currently asymptomatic   Moderate protein caloric malnutrition   Hypomagnesemia Hypokalemia Likely secondary to poor p.o. intake, abdominal mass/cancer and alcohol abuse  -Replete as needed

## 2023-12-26 NOTE — Progress Notes (Signed)
 2 Days Post-Op   Subjective/Chief Complaint: No change   Objective: Vital signs in last 24 hours: Temp:  [98.4 F (36.9 C)-98.7 F (37.1 C)] 98.7 F (37.1 C) (08/12 0515) Pulse Rate:  [73-89] 73 (08/12 0515) Resp:  [16-18] 18 (08/12 0515) BP: (127-135)/(85-96) 135/96 (08/12 0515) SpO2:  [96 %-97 %] 96 % (08/12 0515) Last BM Date : 12/24/23  Intake/Output from previous day: 08/11 0701 - 08/12 0700 In: 420 [P.O.:420] Out: -  Intake/Output this shift: Total I/O In: 240 [P.O.:240] Out: -   Ab soft nontender  Lab Results:  Recent Labs    12/25/23 0533 12/26/23 0518  WBC 5.9 6.7  HGB 7.4* 7.8*  HCT 26.0* 26.2*  PLT 460* 487*   BMET Recent Labs    12/25/23 0533 12/26/23 0518  NA 139 135  K 3.6 4.1  CL 106 103  CO2 25 26  GLUCOSE 87 99  BUN <5* 6  CREATININE 0.68 0.79  CALCIUM  8.6* 8.9   PT/INR No results for input(s): LABPROT, INR in the last 72 hours. ABG No results for input(s): PHART, HCO3 in the last 72 hours.  Invalid input(s): PCO2, PO2  Studies/Results: No results found.  Anti-infectives: Anti-infectives (From admission, onward)    None       Assessment/Plan: Right colon cancer -no evidence metastatic disease with chest ct pending -he does have anemia and some obstructive symptoms however so I think surgery indicated. Discussed pathology today -discussed right colectomy which may be difficult given size of tumor. Discussed possible ileostomy given how case goes as well as ntn status. Discussed leak risk of anastomosis done. -risks, recovery all discussed with him today and will proceed in am -will check pt/inr and t/s active    Rodney [name] 12/26/2023

## 2023-12-26 NOTE — Progress Notes (Signed)
 Progress Note    Rodney [name]   FMW:[id number]  DOB: [birth year]  DOA: 12/22/2023     4 PCP: Rollene Almarie LABOR, MD  Initial CC: Abdominal pain  Hospital Course: Rodney [name] is a 63 y.o. male with medical history significant for alcohol abuse, drinks 1 pint of whiskey daily(last drink just prior to admission), current tobacco abuse, 50-pack-year history, who presented to the ER with worsening abdominal pain noted to be markedly anemic with positive FOBT.  CT imaging confirms large 10.3 cm mas adjacent to ascending colon.  Hospitalist called for admission, general surgery and GI called in consult.   Assessment & Plan:   Principal Problem:   Colonic mass Active Problems:   Mass of hepatic flexure of colon   Anxiety   Alcohol abuse   Essential hypertension, benign   IDA (iron  deficiency anemia)   History of panic attacks   Inguinal hernia, right   Hypokalemia   Hypoalbuminemia   Acute symptomatic anemia Acute blood loss anemia on chronic iron  deficiency anemia versus chronic disease Thrombocytosis (? Reactive) Hemoglobin 6 g/dL initially with positive FOBT - Transfusing as needed  Colonic adenocarcinoma Abdominal pain -Concern for malignancy.  10.3 cm mass adjacent to the ascending colon - s/p colonoscopy on 12/24/2023 with biopsies obtained; pathology has returned with invasive moderately differentiated adenocarcinoma - General Surgery following, tentative plan for resection 12/27/2023 - Will consult oncology as well to inform - No signs of metastatic disease on CT chest   Questionable cirrhosis Noted on CT, will need further testing to confirm  Appreciate GI recommendations   Alcohol abuse  High risk for alcohol withdrawal 1 pint liquor per day, last drink 12/22/2023 Patient is not interested in quitting. CIWA protocol in place -highest risk for withdrawals on 8/11, 8/12   Tobacco abuse 50-pack-year history Tobacco cessation counseling done at bedside, the  patient is not interested in quitting.   Coronary artery atherosclerosis.  Aortic atherosclerosis. Denies any anginal symptoms. Continue to monitor on telemetry   Nephrolithiasis - Dominant 3.8 cm stone within the left extra renal pelvis without significant obstruction, incidental finding.  Currently asymptomatic   Moderate protein caloric malnutrition  Hypomagnesemia Hypokalemia Likely secondary to poor p.o. intake, abdominal mass/cancer and alcohol abuse  -Replete as needed  Interval History:  No events overnight.  Patient having many questions about upcoming surgery. Understands plan for CT chest today for staging purposes.  Old records reviewed in assessment of this patient  Antimicrobials:   DVT prophylaxis:  SCDs Start: 12/22/23 1936   Code Status:   Code Status: Full Code  Mobility Assessment (Last 72 Hours)     Mobility Assessment     Row Name 12/26/23 0853 12/25/23 2000 12/25/23 1125 12/25/23 0927 12/24/23 1915   Does the patient have exclusion criteria? No - Perform mobility assessment No - Perform mobility assessment No - Perform mobility assessment No - Perform mobility assessment No - Perform mobility assessment   What is the highest level of mobility based on the mobility assessment? Level 5 (Ambulates independently) - Balance while walking independently - Complete Level 5 (Ambulates independently) - Balance while walking independently - Complete Level 5 (Ambulates independently) - Balance while walking independently - Complete Level 5 (Ambulates independently) - Balance while walking independently - Complete Level 5 (Ambulates independently) - Balance while walking independently - Complete    Row Name 12/24/23 0758 12/23/23 1933         Does the patient have exclusion criteria? No - Perform  mobility assessment No - Perform mobility assessment      What is the highest level of mobility based on the mobility assessment? Level 5 (Ambulates independently) -  Balance while walking independently - Complete Level 5 (Ambulates independently) - Balance while walking independently - Complete         Barriers to discharge: None Disposition Plan: Home HH orders placed: N/A Status is: Inpatient  Objective: Blood pressure (!) 120/93, pulse 91, temperature 99.7 F (37.6 C), temperature source Oral, resp. rate 18, height 5' 9 (1.753 m), weight 77.2 kg, SpO2 97%.  Examination:  Physical Exam Constitutional:      General: He is not in acute distress.    Appearance: Normal appearance.  HENT:     Head: Normocephalic and atraumatic.     Mouth/Throat:     Mouth: Mucous membranes are moist.  Eyes:     Extraocular Movements: Extraocular movements intact.  Cardiovascular:     Rate and Rhythm: Normal rate and regular rhythm.  Pulmonary:     Effort: Pulmonary effort is normal. No respiratory distress.     Breath sounds: Normal breath sounds. No wheezing.  Abdominal:     General: Bowel sounds are normal. There is no distension.     Palpations: Abdomen is soft.     Tenderness: There is no abdominal tenderness.  Musculoskeletal:        General: Normal range of motion.     Cervical back: Normal range of motion and neck supple.  Skin:    General: Skin is warm and dry.  Neurological:     General: No focal deficit present.     Mental Status: He is alert.  Psychiatric:        Mood and Affect: Mood normal.        Behavior: Behavior normal.      Consultants:  GI General surgery  Procedures:  12/24/2023: Colonoscopy  Data Reviewed: Results for orders placed or performed during the hospital encounter of 12/22/23 (from the past 24 hours)  CBC     Status: Abnormal   Collection Time: 12/26/23  5:18 AM  Result Value Ref Range   WBC 6.7 4.0 - 10.5 K/uL   RBC 3.36 (L) 4.22 - 5.81 MIL/uL   Hemoglobin 7.8 (L) 13.0 - 17.0 g/dL   HCT 73.7 (L) 60.9 - 47.9 %   MCV 78.0 (L) 80.0 - 100.0 fL   MCH 23.2 (L) 26.0 - 34.0 pg   MCHC 29.8 (L) 30.0 - 36.0 g/dL    RDW 78.7 (H) 88.4 - 15.5 %   Platelets 487 (H) 150 - 400 K/uL   nRBC 0.0 0.0 - 0.2 %  Comprehensive metabolic panel with GFR     Status: Abnormal   Collection Time: 12/26/23  5:18 AM  Result Value Ref Range   Sodium 135 135 - 145 mmol/L   Potassium 4.1 3.5 - 5.1 mmol/L   Chloride 103 98 - 111 mmol/L   CO2 26 22 - 32 mmol/L   Glucose, Bld 99 70 - 99 mg/dL   BUN 6 6 - 20 mg/dL   Creatinine, Ser 9.20 0.61 - 1.24 mg/dL   Calcium  8.9 8.9 - 10.3 mg/dL   Total Protein 6.6 6.5 - 8.1 g/dL   Albumin 2.3 (L) 3.5 - 5.0 g/dL   AST 30 15 - 41 U/L   ALT 12 0 - 44 U/L   Alkaline Phosphatase 81 38 - 126 U/L   Total Bilirubin 0.5 0.0 - 1.2 mg/dL  GFR, Estimated >60 >60 mL/min   Anion gap 6 5 - 15  Type and screen [facility]     Status: None   Collection Time: 12/26/23 12:37 PM  Result Value Ref Range   ABO/RH(D) O POS    Antibody Screen NEG    Sample Expiration      12/29/2023,2359 Performed at [facility], [address], [place], [place] [address]   Protime-INR     Status: None   Collection Time: 12/26/23  1:37 PM  Result Value Ref Range   Prothrombin Time 14.9 11.4 - 15.2 seconds   INR 1.1 0.8 - 1.2    I have reviewed pertinent nursing notes, vitals, labs, and images as necessary. I have ordered labwork to follow up on as indicated.  I have reviewed the last notes from staff over past 24 hours. I have discussed patient's care plan and test results with nursing staff, CM/SW, and other staff as appropriate.  Time spent: Greater than 50% of the 55 minute visit was spent in counseling/coordination of care for the patient as laid out in the A&P.   LOS: 4 days   Alm Apo, MD [facility] 12/26/2023, 4:51 PM

## 2023-12-27 ENCOUNTER — Inpatient Hospital Stay (HOSPITAL_COMMUNITY): Payer: Self-pay | Admitting: Certified Registered"

## 2023-12-27 ENCOUNTER — Encounter (HOSPITAL_COMMUNITY)
Admission: EM | Disposition: A | Discharge status: Home / Self Care | Payer: Self-pay | Source: Home / Self Care | Attending: Internal Medicine

## 2023-12-27 ENCOUNTER — Encounter (HOSPITAL_COMMUNITY): Payer: Self-pay | Admitting: Internal Medicine

## 2023-12-27 ENCOUNTER — Other Ambulatory Visit: Payer: Self-pay

## 2023-12-27 DIAGNOSIS — C189 Malignant neoplasm of colon, unspecified: Secondary | ICD-10-CM

## 2023-12-27 DIAGNOSIS — I10 Essential (primary) hypertension: Secondary | ICD-10-CM

## 2023-12-27 DIAGNOSIS — F419 Anxiety disorder, unspecified: Secondary | ICD-10-CM

## 2023-12-27 DIAGNOSIS — F17210 Nicotine dependence, cigarettes, uncomplicated: Secondary | ICD-10-CM

## 2023-12-27 HISTORY — PX: LAPAROTOMY: SHX154

## 2023-12-27 HISTORY — PX: COLOSTOMY REVISION: SHX5232

## 2023-12-27 LAB — CBC
HCT: 27.2 % — ABNORMAL LOW (ref 39.0–52.0)
Hemoglobin: 7.8 g/dL — ABNORMAL LOW (ref 13.0–17.0)
MCH: 22.9 pg — ABNORMAL LOW (ref 26.0–34.0)
MCHC: 28.7 g/dL — ABNORMAL LOW (ref 30.0–36.0)
MCV: 79.8 fL — ABNORMAL LOW (ref 80.0–100.0)
Platelets: 481 K/uL — ABNORMAL HIGH (ref 150–400)
RBC: 3.41 MIL/uL — ABNORMAL LOW (ref 4.22–5.81)
RDW: 21.6 % — ABNORMAL HIGH (ref 11.5–15.5)
WBC: 6.6 K/uL (ref 4.0–10.5)
nRBC: 0 % (ref 0.0–0.2)

## 2023-12-27 LAB — COMPREHENSIVE METABOLIC PANEL WITH GFR
ALT: 12 U/L (ref 0–44)
AST: 32 U/L (ref 15–41)
Albumin: 2.3 g/dL — ABNORMAL LOW (ref 3.5–5.0)
Alkaline Phosphatase: 78 U/L (ref 38–126)
Anion gap: 9 (ref 5–15)
BUN: 7 mg/dL (ref 6–20)
CO2: 25 mmol/L (ref 22–32)
Calcium: 8.8 mg/dL — ABNORMAL LOW (ref 8.9–10.3)
Chloride: 100 mmol/L (ref 98–111)
Creatinine, Ser: 0.75 mg/dL (ref 0.61–1.24)
GFR, Estimated: >60 mL/min (ref >60)
Glucose, Bld: 90 mg/dL (ref 70–99)
Potassium: 4.1 mmol/L (ref 3.5–5.1)
Sodium: 134 mmol/L — ABNORMAL LOW (ref 135–145)
Total Bilirubin: 0.4 mg/dL (ref 0.0–1.2)
Total Protein: 6.7 g/dL (ref 6.5–8.1)

## 2023-12-27 LAB — PREPARE RBC (CROSSMATCH)

## 2023-12-27 SURGERY — LAPAROTOMY, EXPLORATORY
Anesthesia: General | Laterality: Right

## 2023-12-27 MED ORDER — BUPIVACAINE-EPINEPHRINE (PF) 0.25% -1:200000 IJ SOLN
INTRAMUSCULAR | Status: AC
  Filled 2023-12-27: qty 30

## 2023-12-27 MED ORDER — HYDROMORPHONE HCL 1 MG/ML IJ SOLN
INTRAMUSCULAR | Status: DC | PRN
  Administered 2023-12-27: .25 mg via INTRAVENOUS
  Administered 2023-12-27: .5 mg via INTRAVENOUS
  Administered 2023-12-27 (×3): .25 mg via INTRAVENOUS
  Administered 2023-12-27: .5 mg via INTRAVENOUS

## 2023-12-27 MED ORDER — HYDROMORPHONE HCL 1 MG/ML IJ SOLN
INTRAMUSCULAR | Status: AC
  Filled 2023-12-27: qty 1

## 2023-12-27 MED ORDER — ACETAMINOPHEN 500 MG PO TABS: 1000.0000 mg | ORAL_TABLET | Freq: Once | ORAL | Status: DC

## 2023-12-27 MED ORDER — SODIUM CHLORIDE 0.9 % IV SOLN: INTRAVENOUS | Status: DC | PRN

## 2023-12-27 MED ORDER — FENTANYL CITRATE (PF) 100 MCG/2ML IJ SOLN
INTRAMUSCULAR | Status: AC
  Filled 2023-12-27: qty 2

## 2023-12-27 MED ORDER — LIDOCAINE HCL (CARDIAC) PF 100 MG/5ML IV SOSY
PREFILLED_SYRINGE | INTRAVENOUS | Status: DC | PRN
  Administered 2023-12-27 (×2): 60 mg via INTRAVENOUS

## 2023-12-27 MED ORDER — HYDROMORPHONE HCL 1 MG/ML IJ SOLN
0.2500 mg | INTRAMUSCULAR | Status: DC | PRN
  Administered 2023-12-27 (×8): 0.5 mg via INTRAVENOUS

## 2023-12-27 MED ORDER — PHENYLEPHRINE HCL-NACL 20-0.9 MG/250ML-% IV SOLN
INTRAVENOUS | Status: DC | PRN
  Administered 2023-12-27 (×2): 30 ug/min via INTRAVENOUS

## 2023-12-27 MED ORDER — ONDANSETRON HCL 4 MG/2ML IJ SOLN
INTRAMUSCULAR | Status: DC | PRN
  Administered 2023-12-27 (×2): 4 mg via INTRAVENOUS

## 2023-12-27 MED ORDER — SUGAMMADEX SODIUM 200 MG/2ML IV SOLN
INTRAVENOUS | Status: AC
  Filled 2023-12-27: qty 2

## 2023-12-27 MED ORDER — OXYCODONE HCL 5 MG PO TABS: 5.0000 mg | ORAL_TABLET | Freq: Once | ORAL | Status: DC | PRN

## 2023-12-27 MED ORDER — AMISULPRIDE (ANTIEMETIC) 5 MG/2ML IV SOLN: 10.0000 mg | Freq: Once | INTRAVENOUS | Status: DC | PRN

## 2023-12-27 MED ORDER — CHLORHEXIDINE GLUCONATE 0.12 % MT SOLN
15.0000 mL | Freq: Once | OROMUCOSAL | Status: AC
  Administered 2023-12-27 (×2): 15 mL via OROMUCOSAL

## 2023-12-27 MED ORDER — ONDANSETRON HCL 4 MG/2ML IJ SOLN
INTRAMUSCULAR | Status: AC
Start: 2023-12-27 — End: 2023-12-27
  Filled 2023-12-27: qty 2

## 2023-12-27 MED ORDER — ROCURONIUM BROMIDE 10 MG/ML (PF) SYRINGE
PREFILLED_SYRINGE | INTRAVENOUS | Status: AC
  Filled 2023-12-27: qty 10

## 2023-12-27 MED ORDER — ENSURE PRE-SURGERY PO LIQD
296.0000 mL | Freq: Once | ORAL | Status: AC
  Administered 2023-12-27 (×2): 296 mL via ORAL
  Filled 2023-12-27: qty 296

## 2023-12-27 MED ORDER — DEXAMETHASONE SODIUM PHOSPHATE 10 MG/ML IJ SOLN
INTRAMUSCULAR | Status: DC | PRN
  Administered 2023-12-27 (×2): 4 mg via INTRAVENOUS

## 2023-12-27 MED ORDER — MEPERIDINE HCL 100 MG/ML IJ SOLN
INTRAMUSCULAR | Status: AC
  Filled 2023-12-27: qty 1

## 2023-12-27 MED ORDER — OXYCODONE HCL 5 MG/5ML PO SOLN: 5.0000 mg | Freq: Once | ORAL | Status: DC | PRN

## 2023-12-27 MED ORDER — SODIUM CHLORIDE 0.9 % IR SOLN
Status: DC | PRN
  Administered 2023-12-27 (×4): 1000 mL

## 2023-12-27 MED ORDER — AMISULPRIDE (ANTIEMETIC) 5 MG/2ML IV SOLN
INTRAVENOUS | Status: AC
  Filled 2023-12-27: qty 4

## 2023-12-27 MED ORDER — ALVIMOPAN 12 MG PO CAPS
12.0000 mg | ORAL_CAPSULE | ORAL | Status: AC
  Administered 2023-12-27 (×2): 12 mg via ORAL
  Filled 2023-12-27: qty 1

## 2023-12-27 MED ORDER — SODIUM CHLORIDE 0.9 % IV SOLN
2.0000 g | INTRAVENOUS | Status: AC
  Administered 2023-12-27 (×2): 2 g via INTRAVENOUS
  Filled 2023-12-27: qty 2

## 2023-12-27 MED ORDER — ROCURONIUM BROMIDE 10 MG/ML (PF) SYRINGE
PREFILLED_SYRINGE | INTRAVENOUS | Status: DC | PRN
  Administered 2023-12-27 (×2): 80 mg via INTRAVENOUS

## 2023-12-27 MED ORDER — ONDANSETRON HCL 4 MG/2ML IJ SOLN
INTRAMUSCULAR | Status: AC
  Filled 2023-12-27: qty 2

## 2023-12-27 MED ORDER — HYDROMORPHONE HCL 1 MG/ML IJ SOLN
0.5000 mg | INTRAMUSCULAR | Status: DC | PRN
  Administered 2023-12-27 (×2): 2 mg via INTRAVENOUS
  Administered 2023-12-27 – 2023-12-28 (×3): 1 mg via INTRAVENOUS
  Administered 2023-12-28 (×2): 2 mg via INTRAVENOUS
  Administered 2023-12-28: 1 mg via INTRAVENOUS
  Administered 2023-12-28 – 2023-12-29 (×3): 2 mg via INTRAVENOUS
  Administered 2023-12-29: 1 mg via INTRAVENOUS
  Administered 2023-12-29 – 2023-12-30 (×3): 2 mg via INTRAVENOUS
  Administered 2023-12-30: 1 mg via INTRAVENOUS
  Administered 2023-12-30 – 2023-12-31 (×3): 2 mg via INTRAVENOUS
  Administered 2023-12-31: 1 mg via INTRAVENOUS
  Administered 2024-01-01: 2 mg via INTRAVENOUS
  Filled 2023-12-27 (×3): qty 2
  Filled 2023-12-27: qty 1
  Filled 2023-12-27: qty 2
  Filled 2023-12-27: qty 1
  Filled 2023-12-27 (×3): qty 2
  Filled 2023-12-27 (×2): qty 1
  Filled 2023-12-27: qty 2
  Filled 2023-12-27: qty 1
  Filled 2023-12-27 (×7): qty 2

## 2023-12-27 MED ORDER — MIDAZOLAM HCL 2 MG/2ML IJ SOLN
INTRAMUSCULAR | Status: DC | PRN
  Administered 2023-12-27 (×2): 2 mg via INTRAVENOUS

## 2023-12-27 MED ORDER — ACETAMINOPHEN 10 MG/ML IV SOLN
1000.0000 mg | Freq: Four times a day (QID) | INTRAVENOUS | Status: AC
  Administered 2023-12-27 – 2023-12-28 (×7): 1000 mg via INTRAVENOUS
  Filled 2023-12-27 (×4): qty 100

## 2023-12-27 MED ORDER — PROPOFOL 10 MG/ML IV BOLUS
INTRAVENOUS | Status: AC
  Filled 2023-12-27: qty 20

## 2023-12-27 MED ORDER — PHENYLEPHRINE 80 MCG/ML (10ML) SYRINGE FOR IV PUSH (FOR BLOOD PRESSURE SUPPORT)
PREFILLED_SYRINGE | INTRAVENOUS | Status: DC | PRN
  Administered 2023-12-27 (×6): 80 ug via INTRAVENOUS

## 2023-12-27 MED ORDER — HYDROMORPHONE HCL 1 MG/ML IJ SOLN
0.2500 mg | INTRAMUSCULAR | Status: DC | PRN
  Administered 2023-12-27 (×2): 0.5 mg via INTRAVENOUS

## 2023-12-27 MED ORDER — KETAMINE HCL 50 MG/5ML IJ SOSY
PREFILLED_SYRINGE | INTRAMUSCULAR | Status: AC
  Filled 2023-12-27: qty 5

## 2023-12-27 MED ORDER — PROPOFOL 10 MG/ML IV BOLUS
INTRAVENOUS | Status: DC | PRN
  Administered 2023-12-27 (×2): 130 mg via INTRAVENOUS

## 2023-12-27 MED ORDER — SODIUM CHLORIDE (PF) 0.9 % IJ SOLN
INTRAMUSCULAR | Status: AC
  Filled 2023-12-27: qty 10

## 2023-12-27 MED ORDER — DEXAMETHASONE SODIUM PHOSPHATE 10 MG/ML IJ SOLN
INTRAMUSCULAR | Status: AC
  Filled 2023-12-27: qty 1

## 2023-12-27 MED ORDER — ACETAMINOPHEN 500 MG PO TABS
1000.0000 mg | ORAL_TABLET | ORAL | Status: AC
  Administered 2023-12-27 (×2): 1000 mg via ORAL
  Filled 2023-12-27: qty 2

## 2023-12-27 MED ORDER — SUGAMMADEX SODIUM 200 MG/2ML IV SOLN
INTRAVENOUS | Status: DC | PRN
  Administered 2023-12-27 (×2): 200 mg via INTRAVENOUS

## 2023-12-27 MED ORDER — LACTATED RINGERS IV SOLN: INTRAVENOUS | Status: DC

## 2023-12-27 MED ORDER — KETAMINE HCL 50 MG/5ML IJ SOSY
PREFILLED_SYRINGE | INTRAMUSCULAR | Status: DC | PRN
  Administered 2023-12-27 (×2): 35 mg via INTRAVENOUS

## 2023-12-27 MED ORDER — ONDANSETRON HCL 4 MG/2ML IJ SOLN
4.0000 mg | Freq: Once | INTRAMUSCULAR | Status: AC | PRN
  Administered 2023-12-27 (×2): 4 mg via INTRAVENOUS

## 2023-12-27 MED ORDER — MEPERIDINE HCL 25 MG/ML IJ SOLN
6.2500 mg | INTRAMUSCULAR | Status: DC | PRN
  Filled 2023-12-27: qty 1

## 2023-12-27 MED ORDER — MIDAZOLAM HCL 2 MG/2ML IJ SOLN
INTRAMUSCULAR | Status: AC
  Filled 2023-12-27: qty 2

## 2023-12-27 MED ORDER — SODIUM CHLORIDE 0.9% IV SOLUTION: Freq: Once | INTRAVENOUS | Status: DC

## 2023-12-27 MED ORDER — HYDROMORPHONE HCL 2 MG/ML IJ SOLN
INTRAMUSCULAR | Status: AC
  Filled 2023-12-27: qty 1

## 2023-12-27 MED ORDER — FENTANYL CITRATE (PF) 100 MCG/2ML IJ SOLN
INTRAMUSCULAR | Status: DC | PRN
  Administered 2023-12-27: 100 ug via INTRAVENOUS
  Administered 2023-12-27: 50 ug via INTRAVENOUS
  Administered 2023-12-27: 100 ug via INTRAVENOUS
  Administered 2023-12-27 (×3): 50 ug via INTRAVENOUS

## 2023-12-27 SURGICAL SUPPLY — 33 items
BAG COUNTER SPONGE SURGICOUNT (BAG) IMPLANT
BLADE EXTENDED COATED 6.5IN (ELECTRODE) IMPLANT
CHLORAPREP W/TINT 26 (MISCELLANEOUS) ×3 IMPLANT
COVER MAYO STAND STRL (DRAPES) ×9 IMPLANT
COVER SURGICAL LIGHT HANDLE (MISCELLANEOUS) ×3 IMPLANT
DRAPE LAPAROSCOPIC ABDOMINAL (DRAPES) ×3 IMPLANT
DRSG OPSITE POSTOP 4X10 (GAUZE/BANDAGES/DRESSINGS) IMPLANT
DRSG OPSITE POSTOP 4X6 (GAUZE/BANDAGES/DRESSINGS) IMPLANT
DRSG OPSITE POSTOP 4X8 (GAUZE/BANDAGES/DRESSINGS) IMPLANT
ELECT BLADE TIP CTD 4 INCH (ELECTRODE) IMPLANT
ELECT REM PT RETURN 15FT ADLT (MISCELLANEOUS) ×3 IMPLANT
GAUZE SPONGE 4X4 12PLY STRL (GAUZE/BANDAGES/DRESSINGS) IMPLANT
GLOVE BIO SURGEON STRL SZ7 (GLOVE) ×6 IMPLANT
GLOVE BIOGEL PI IND STRL 7.5 (GLOVE) ×6 IMPLANT
GOWN STRL REUS W/ TWL LRG LVL3 (GOWN DISPOSABLE) ×6 IMPLANT
HANDLE SUCTION POOLE (INSTRUMENTS) IMPLANT
KIT TURNOVER KIT A (KITS) ×3 IMPLANT
PACK COLON (CUSTOM PROCEDURE TRAY) ×3 IMPLANT
PENCIL SMOKE EVACUATOR (MISCELLANEOUS) IMPLANT
RELOAD STAPLE 75 3.8 BLU REG (ENDOMECHANICALS) IMPLANT
STAPLER GUN LINEAR PROX 60 (STAPLE) IMPLANT
STAPLER PROXIMATE 75MM BLUE (STAPLE) IMPLANT
STAPLER SKIN PROX 35W (STAPLE) ×3 IMPLANT
SUT NOVA NAB GS-21 0 18 T12 DT (SUTURE) IMPLANT
SUT NOVA NAB GS-21 1 T12 (SUTURE) IMPLANT
SUT PDS AB 1 TP1 96 (SUTURE) IMPLANT
SUT SILK 2 0 SH CR/8 (SUTURE) ×3 IMPLANT
SUT SILK 2-0 18XBRD TIE 12 (SUTURE) ×3 IMPLANT
SUT SILK 3 0 SH CR/8 (SUTURE) ×3 IMPLANT
SUT SILK 3-0 18XBRD TIE 12 (SUTURE) ×3 IMPLANT
TOWEL OR 17X26 10 PK STRL BLUE (TOWEL DISPOSABLE) IMPLANT
TRAY FOLEY MTR SLVR 14FR STAT (SET/KITS/TRAYS/PACK) IMPLANT
TRAY FOLEY MTR SLVR 16FR STAT (SET/KITS/TRAYS/PACK) IMPLANT

## 2023-12-27 NOTE — Plan of Care (Signed)

## 2023-12-27 NOTE — Transfer of Care (Signed)
 Immediate Anesthesia Transfer of Care Note  Patient: Rodney [name]  Procedure(s) Performed: LAPAROTOMY, EXPLORATORY COLECTOMY, RIGHT (Right)  Patient Location: PACU  Anesthesia Type:General  Level of Consciousness: awake, alert , and patient cooperative  Airway & Oxygen Therapy: Patient Spontanous Breathing and Patient connected to face mask oxygen  Post-op Assessment: Report given to RN and Post -op Vital signs reviewed and stable  Post vital signs: Reviewed and stable  Last Vitals:  Vitals Value Taken Time  BP 161/101 12/27/23 10:17  Temp    Pulse 83 12/27/23 10:17  Resp 16 12/27/23 10:18  SpO2 94 % 12/27/23 10:17  Vitals shown include unfiled device data.  Last Pain:  Vitals:   12/27/23 0734  TempSrc:   PainSc: 0-No pain      Patients Stated Pain Goal: 0 (12/26/23 2330)  Complications: No notable events documented.

## 2023-12-27 NOTE — Anesthesia Preprocedure Evaluation (Addendum)
 Anesthesia Evaluation  Patient identified by MRN, date of birth, ID band Patient awake    Reviewed: Allergy & Precautions, H&P , NPO status , Patient's Chart, lab work & pertinent test results  Airway Mallampati: III  TM Distance: >3 FB Neck ROM: Full    Dental  (+) Teeth Intact, Dental Advisory Given   Pulmonary Current Smoker 1 ppd, 50 pack year history    Pulmonary exam normal breath sounds clear to auscultation       Cardiovascular hypertension (121/90, no home meds), Normal cardiovascular exam Rhythm:Regular Rate:Normal     Neuro/Psych  PSYCHIATRIC DISORDERS Anxiety     negative neurological ROS     GI/Hepatic Neg liver ROS,,,R CRC   Endo/Other  negative endocrine ROS    Renal/GU negative Renal ROS  negative genitourinary   Musculoskeletal negative musculoskeletal ROS (+)    Abdominal   Peds negative pediatric ROS (+)  Hematology  (+) Blood dyscrasia, anemia Hb 7.8, plt 481   Anesthesia Other Findings   Reproductive/Obstetrics negative OB ROS                              Anesthesia Physical Anesthesia Plan  ASA: 3  Anesthesia Plan: General   Post-op Pain Management: Ofirmev  IV (intra-op)*   Induction: Intravenous  PONV Risk Score and Plan: 1 and Ondansetron , Dexamethasone , Midazolam  and Treatment may vary due to age or medical condition  Airway Management Planned: Oral ETT  Additional Equipment: None  Intra-op Plan:   Post-operative Plan: Extubation in OR  Informed Consent:   Plan Discussed with:   Anesthesia Plan Comments: (Crossed for 2 units )         Anesthesia Quick Evaluation

## 2023-12-27 NOTE — Progress Notes (Signed)
 Progress Note    Rodney [name]   FMW:[id number]  DOB: [birth year]  DOA: 12/22/2023     5 PCP: Rollene Almarie LABOR, MD  Initial CC: Abdominal pain  Hospital Course: Rodney [name] is a 63 y.o. male with medical history significant for alcohol abuse, drinks 1 pint of whiskey daily(last drink just prior to admission), current tobacco abuse, 50-pack-year history, who presented to the ER with worsening abdominal pain noted to be markedly anemic with positive FOBT.  CT imaging confirms large 10.3 cm mas adjacent to ascending colon.  Hospitalist called for admission, general surgery and GI called in consult.   Assessment & Plan:   Acute symptomatic anemia Acute blood loss anemia on chronic iron  deficiency anemia versus chronic disease Thrombocytosis (? Reactive) Hemoglobin 6 g/dL initially with positive FOBT - Transfusing as needed  Colonic adenocarcinoma Abdominal pain -10.3 cm mass adjacent to the ascending colon - s/p colonoscopy on 12/24/2023 with biopsies obtained; pathology has returned with invasive moderately differentiated adenocarcinoma - General Surgery following - Underwent right colectomy on 12/27/2023 - Also discussed with oncology; recommended for outpatient referral, they will set up appointment - No signs of metastatic disease on CT chest -Diet advancement postop per surgery   Questionable cirrhosis Noted on CT, will need further testing to confirm  Appreciate GI recommendations   Alcohol abuse  1 pint liquor per day, last drink 12/22/2023 Patient is not interested in quitting. - no s/s w/d at this time; can d/c CIWA   Tobacco abuse 50-pack-year history Tobacco cessation counseling done at bedside; he's more interested in cessation at this time   Coronary artery atherosclerosis.  Aortic atherosclerosis. Denies any anginal symptoms. Continue to monitor on telemetry   Nephrolithiasis - Dominant 3.8 cm stone within the left extra renal pelvis without significant  obstruction, incidental finding.  Currently asymptomatic   Moderate protein caloric malnutrition  Hypomagnesemia Hypokalemia Likely secondary to poor p.o. intake, abdominal mass/cancer and alcohol abuse  -Replete as needed  Interval History:  Seen in his room this afternoon after returning from surgery.  Tolerated well.  He was awake and alert with family bedside.  Old records reviewed in assessment of this patient  Antimicrobials:   DVT prophylaxis:  SCDs Start: 12/22/23 1936   Code Status:   Code Status: Full Code  Mobility Assessment (Last 72 Hours)     Mobility Assessment     Row Name 12/27/23 0715 12/26/23 1915 12/26/23 0853 12/25/23 2000 12/25/23 1125   Does the patient have exclusion criteria? No - Perform mobility assessment No - Perform mobility assessment No - Perform mobility assessment No - Perform mobility assessment No - Perform mobility assessment   What is the highest level of mobility based on the mobility assessment? Level 5 (Ambulates independently) - Balance while walking independently - Complete Level 5 (Ambulates independently) - Balance while walking independently - Complete Level 5 (Ambulates independently) - Balance while walking independently - Complete Level 5 (Ambulates independently) - Balance while walking independently - Complete Level 5 (Ambulates independently) - Balance while walking independently - Complete    Row Name 12/25/23 0927 12/24/23 1915         Does the patient have exclusion criteria? No - Perform mobility assessment No - Perform mobility assessment      What is the highest level of mobility based on the mobility assessment? Level 5 (Ambulates independently) - Balance while walking independently - Complete Level 5 (Ambulates independently) - Balance while walking independently - Complete  Barriers to discharge: None Disposition Plan: Home HH orders placed: N/A Status is: Inpatient  Objective: Blood pressure (!) 123/94,  pulse 77, temperature 97.7 F (36.5 C), temperature source Oral, resp. rate 18, height 5' 9 (1.753 m), weight 72.1 kg, SpO2 97%.  Examination:  Physical Exam Constitutional:      General: He is not in acute distress.    Appearance: Normal appearance.  HENT:     Head: Normocephalic and atraumatic.     Mouth/Throat:     Mouth: Mucous membranes are moist.  Eyes:     Extraocular Movements: Extraocular movements intact.  Cardiovascular:     Rate and Rhythm: Normal rate and regular rhythm.  Pulmonary:     Effort: Pulmonary effort is normal. No respiratory distress.     Breath sounds: Normal breath sounds. No wheezing.  Abdominal:     General: Bowel sounds are normal. There is no distension.     Palpations: Abdomen is soft.     Tenderness: There is no abdominal tenderness.     Comments: Midline abdominal incision noted with staples and honeycomb dressing in place  Musculoskeletal:        General: Normal range of motion.     Cervical back: Normal range of motion and neck supple.  Skin:    General: Skin is warm and dry.  Neurological:     General: No focal deficit present.     Mental Status: He is alert.  Psychiatric:        Mood and Affect: Mood normal.        Behavior: Behavior normal.      Consultants:  GI General surgery  Procedures:  12/24/2023: Colonoscopy 12/27/2023: Right colectomy  Data Reviewed: Results for orders placed or performed during the hospital encounter of 12/22/23 (from the past 24 hours)  CBC     Status: Abnormal   Collection Time: 12/27/23  5:34 AM  Result Value Ref Range   WBC 6.6 4.0 - 10.5 K/uL   RBC 3.41 (L) 4.22 - 5.81 MIL/uL   Hemoglobin 7.8 (L) 13.0 - 17.0 g/dL   HCT 72.7 (L) 60.9 - 47.9 %   MCV 79.8 (L) 80.0 - 100.0 fL   MCH 22.9 (L) 26.0 - 34.0 pg   MCHC 28.7 (L) 30.0 - 36.0 g/dL   RDW 78.3 (H) 88.4 - 84.4 %   Platelets 481 (H) 150 - 400 K/uL   nRBC 0.0 0.0 - 0.2 %  Comprehensive metabolic panel with GFR     Status: Abnormal    Collection Time: 12/27/23  5:34 AM  Result Value Ref Range   Sodium 134 (L) 135 - 145 mmol/L   Potassium 4.1 3.5 - 5.1 mmol/L   Chloride 100 98 - 111 mmol/L   CO2 25 22 - 32 mmol/L   Glucose, Bld 90 70 - 99 mg/dL   BUN 7 6 - 20 mg/dL   Creatinine, Ser 9.24 0.61 - 1.24 mg/dL   Calcium  8.8 (L) 8.9 - 10.3 mg/dL   Total Protein 6.7 6.5 - 8.1 g/dL   Albumin 2.3 (L) 3.5 - 5.0 g/dL   AST 32 15 - 41 U/L   ALT 12 0 - 44 U/L   Alkaline Phosphatase 78 38 - 126 U/L   Total Bilirubin 0.4 0.0 - 1.2 mg/dL   GFR, Estimated >39 >39 mL/min   Anion gap 9 5 - 15  Prepare RBC (crossmatch)     Status: None   Collection Time: 12/27/23  8:10 AM  Result Value Ref Range   Order Confirmation      ORDER PROCESSED BY BLOOD BANK Performed at [facility], [address], [place], [place] [address]     I have reviewed pertinent nursing notes, vitals, labs, and images as necessary. I have ordered labwork to follow up on as indicated.  I have reviewed the last notes from staff over past 24 hours. I have discussed patient's care plan and test results with nursing staff, CM/SW, and other staff as appropriate.  Time spent: Greater than 50% of the 55 minute visit was spent in counseling/coordination of care for the patient as laid out in the A&P.   LOS: 5 days   Alm Apo, MD [facility] 12/27/2023, 3:43 PM

## 2023-12-27 NOTE — Op Note (Signed)
 Preoperative diagnosis: Right colon cancer Postoperative diagnosis: Same as above Procedure: Right colectomy Surgeon: Dr. Adina Bury Assistant: Dr. Adina Lima Anesthesia: General Estimated blood loss: Minimal Specimens: Right colon to pathology Complications: None Drains: None Sponge needle count was correct completion Disposition recovery stable addition  Indications: This is a 63 year old male who was admitted with abdominal pain.  He was found to be anemic with a CT scan that showed a large ascending colon mass.  He underwent evaluation with endoscopy that showed this mass and a biopsy showed this to be colon cancer.  He does not have any evidence of metastatic disease.  He is symptomatic with some partial obstruction as well as anemia so I discussed going to the operating room.  Procedure: After informed consent was obtained he was taken to the operating room.  He was given antibiotics.  He was placed under general anesthesia without complication.  He had SCDs in place.  He was prepped and draped in the standard sterile surgical fashion.  Surgical timeout was then performed.  Due to his anemia and possible blood loss he did receive 2 units of blood during the operation.  I made a midline incision and carried this into the abdomen.  The Bookwalter retractor was then placed.  He had no evidence of any peritoneal or hepatic disease.  At this point he did have a very large right colon mass.  The white line was then divided.  The gallbladder was adherent to this disease and this was released using electrocautery.  The colon was slowly rolled medially.  There was a fair amount of desmoplastic reaction but otherwise this did separate from the surrounding tissue and it did not appear to have any gross invasion.  I then took the omentum off the transverse colon.  The transverse colon was divided.  The terminal ileum was brought up as there was some adhesions to the pelvis using scissors.  I then  divided the terminal ileum.  The duodenum was observed and this was not involved with the tumor although it certainly look very close on the CT scan.  I then was able to divide the mesentery with the LigaSure device.  The colon and the mass were then passed off the table as a specimen.  His nutritional status was not ideal but I did think it would be reasonable to reconnect him after a right colectomy as opposed to an ileostomy.  I then brought the 2 ends of small bowel and transverse colon apposition using 3-0 silk sutures.  I closed the mesenteric defect with 3-0 silk sutures.  I then made enterotomies in both.  I used the GIA stapler to create a anastomosis and closed the common enterotomy with a TX stapler.  This was patent.  It was hemostatic.  I then placed 2 3-0 silk apex sutures.  I then tacked the omentum down overlying the anastomosis.  Hemostasis was observed.  I then closed the abdomen with #1 looped PDS and stapled the skin.  He tolerated this well and was transferred to recovery room stable.

## 2023-12-27 NOTE — Anesthesia Procedure Notes (Addendum)
 Procedure Name: Intubation Date/Time: 12/27/2023 8:42 AM  Performed by: Metta Andrea NOVAK, CRNAPre-anesthesia Checklist: Patient identified, Emergency Drugs available, Suction available, Patient being monitored and Timeout performed Patient Re-evaluated:Patient Re-evaluated prior to induction Oxygen Delivery Method: Circle system utilized Preoxygenation: Pre-oxygenation with 100% oxygen Induction Type: IV induction Ventilation: Mask ventilation without difficulty Laryngoscope Size: Mac and 4 Grade View: Grade I Tube type: Oral Tube size: 7.5 mm Number of attempts: 1 Airway Equipment and Method: Stylet Placement Confirmation: ETT inserted through vocal cords under direct vision, positive ETCO2 and breath sounds checked- equal and bilateral Secured at: 22 cm Tube secured with: Tape Dental Injury: Teeth and Oropharynx as per pre-operative assessment

## 2023-12-27 NOTE — Plan of Care (Signed)

## 2023-12-27 NOTE — Anesthesia Postprocedure Evaluation (Signed)
 Anesthesia Post Note  Patient: Rodney [name]  Procedure(s) Performed: LAPAROTOMY, EXPLORATORY COLECTOMY, RIGHT (Right)     Patient location during evaluation: PACU Anesthesia Type: General Level of consciousness: awake and alert, oriented and patient cooperative Pain management: pain level controlled Vital Signs Assessment: post-procedure vital signs reviewed and stable Respiratory status: spontaneous breathing, nonlabored ventilation and respiratory function stable Cardiovascular status: blood pressure returned to baseline and stable Postop Assessment: no apparent nausea or vomiting Anesthetic complications: no   No notable events documented.  Last Vitals:  Vitals:   12/27/23 1115 12/27/23 1130  BP: (!) 154/96 (!) 154/96  Pulse: 85 80  Resp: 10 12  Temp: 36.5 C   SpO2: 96% 100%    Last Pain:  Vitals:   12/27/23 1215  TempSrc:   PainSc: 7                  [name]

## 2023-12-27 NOTE — Progress Notes (Signed)
 * Day of Surgery *   Subjective/Chief Complaint: No change   Objective: Vital signs in last 24 hours: Temp:  [98.5 F (36.9 C)-99.7 F (37.6 C)] 98.5 F (36.9 C) (08/13 0512) Pulse Rate:  [79-91] 79 (08/13 0512) Resp:  [14-18] 14 (08/13 0512) BP: (120-131)/(90-93) 121/90 (08/13 0512) SpO2:  [95 %-97 %] 96 % (08/13 0512) Weight:  [72.1 kg] 72.1 kg (08/13 0734) Last BM Date : 12/25/23  Intake/Output from previous day: 08/12 0701 - 08/13 0700 In: 480 [P.O.:480] Out: -  Intake/Output this shift: No intake/output data recorded.  Ab nontender  Lab Results:  Recent Labs    12/26/23 0518 12/27/23 0534  WBC 6.7 6.6  HGB 7.8* 7.8*  HCT 26.2* 27.2*  PLT 487* 481*   BMET Recent Labs    12/26/23 0518 12/27/23 0534  NA 135 134*  K 4.1 4.1  CL 103 100  CO2 26 25  GLUCOSE 99 90  BUN 6 7  CREATININE 0.79 0.75  CALCIUM  8.9 8.8*   PT/INR Recent Labs    12/26/23 1337  LABPROT 14.9  INR 1.1   ABG No results for input(s): PHART, HCO3 in the last 72 hours.  Invalid input(s): PCO2, PO2  Studies/Results: CT CHEST WO CONTRAST Result Date: 12/26/2023 CLINICAL DATA:  Cough short of breath, colon mass EXAM: CT CHEST WITHOUT CONTRAST TECHNIQUE: Multidetector CT imaging of the chest was performed following the standard protocol without IV contrast. RADIATION DOSE REDUCTION: This exam was performed according to the departmental dose-optimization program which includes automated exposure control, adjustment of the mA and/or kV according to patient size and/or use of iterative reconstruction technique. COMPARISON:  Chest x-ray 12/22/2023, CT abdomen pelvis 12/22/2023 FINDINGS: Cardiovascular: Limited evaluation without intravenous contrast. Mild aortic atherosclerosis. No aneurysm. Multi vessel coronary vascular calcification. Normal cardiac size. No pericardial effusion Mediastinum/Nodes: Patent trachea. No thyroid mass. Subcentimeter mediastinal lymph nodes. Esophagus within  normal limits. Lungs/Pleura: No acute airspace disease, pleural effusion or pneumothorax. No suspicious lung nodules. Minimal mucous plugging within left lower lobe bronchi, series 6, image 107. Upper Abdomen: No acute finding. Partially visualized large stone within the left renal pelvis Musculoskeletal: No acute osseous abnormality. Multilevel Schmorl's node with mild sclerosis. IMPRESSION: 1. No CT evidence for acute intrathoracic abnormality. No evidence for metastatic disease to the chest 2. Multi vessel coronary vascular disease. 3. Partially visualized large stone within the left renal pelvis. 4. Aortic atherosclerosis. Aortic Atherosclerosis (ICD10-I70.0). Electronically Signed   By: Luke Bun M.D.   On: 12/26/2023 16:31    Anti-infectives: Anti-infectives (From admission, onward)    Start     Dose/Rate Route Frequency Ordered Stop   12/27/23 0745  cefoTEtan  (CEFOTAN ) 2 g in sodium chloride  0.9 % 100 mL IVPB        2 g 200 mL/hr over 30 Minutes Intravenous On call to O.R. 12/27/23 0609 12/28/23 0559       Assessment/Plan: Right colon cancer -no evidence metastatic disease with negative chest ct now also -he does have anemia and some obstructive symptoms however so I think surgery indicated. Dont think primary systemic therapy is best option for him and it looks like even though very close to duodenum there is plane and this is resectable.  -discussed right colectomy which may be difficult given size of tumor. Discussed possible ileostomy given how case goes as well as ntn status. Discussed leak risk of anastomosis done. -pt/inr checked due to question of cirrhosis on the ct report and this is normal  Donnice Bury 12/27/2023

## 2023-12-28 ENCOUNTER — Encounter (HOSPITAL_COMMUNITY): Payer: Self-pay | Admitting: General Surgery

## 2023-12-28 DIAGNOSIS — D649 Anemia, unspecified: Secondary | ICD-10-CM

## 2023-12-28 LAB — CBC
HCT: 32.4 % — ABNORMAL LOW (ref 39.0–52.0)
Hemoglobin: 9.8 g/dL — ABNORMAL LOW (ref 13.0–17.0)
MCH: 24.3 pg — ABNORMAL LOW (ref 26.0–34.0)
MCHC: 30.2 g/dL (ref 30.0–36.0)
MCV: 80.4 fL (ref 80.0–100.0)
Platelets: 506 K/uL — ABNORMAL HIGH (ref 150–400)
RBC: 4.03 MIL/uL — ABNORMAL LOW (ref 4.22–5.81)
RDW: 20.6 % — ABNORMAL HIGH (ref 11.5–15.5)
WBC: 12.2 K/uL — ABNORMAL HIGH (ref 4.0–10.5)
nRBC: 0 % (ref 0.0–0.2)

## 2023-12-28 LAB — BPAM RBC
Blood Product Expiration Date: 202509132359
Blood Product Expiration Date: 202509132359
ISSUE DATE / TIME: 202508130816
ISSUE DATE / TIME: 202508130816
Unit Type and Rh: 5100
Unit Type and Rh: 5100

## 2023-12-28 LAB — COMPREHENSIVE METABOLIC PANEL WITH GFR
ALT: 15 U/L (ref 0–44)
AST: 30 U/L (ref 15–41)
Albumin: 2.3 g/dL — ABNORMAL LOW (ref 3.5–5.0)
Alkaline Phosphatase: 71 U/L (ref 38–126)
Anion gap: 7 (ref 5–15)
BUN: 11 mg/dL (ref 6–20)
CO2: 27 mmol/L (ref 22–32)
Calcium: 8.5 mg/dL — ABNORMAL LOW (ref 8.9–10.3)
Chloride: 98 mmol/L (ref 98–111)
Creatinine, Ser: 0.74 mg/dL (ref 0.61–1.24)
GFR, Estimated: >60 mL/min (ref >60)
Glucose, Bld: 108 mg/dL — ABNORMAL HIGH (ref 70–99)
Potassium: 4.4 mmol/L (ref 3.5–5.1)
Sodium: 132 mmol/L — ABNORMAL LOW (ref 135–145)
Total Bilirubin: 0.7 mg/dL (ref 0.0–1.2)
Total Protein: 6.8 g/dL (ref 6.5–8.1)

## 2023-12-28 LAB — TYPE AND SCREEN
ABO/RH(D): O POS
Antibody Screen: NEGATIVE
Unit division: 0
Unit division: 0

## 2023-12-28 NOTE — Progress Notes (Signed)
 1 Day Post-Op   Subjective/Chief Complaint: Doing well Sore no flatus no vomiting    Objective: Vital signs in last 24 hours: Temp:  [97.6 F (36.4 C)-97.8 F (36.6 C)] 97.8 F (36.6 C) (08/14 0444) Pulse Rate:  [69-87] 83 (08/14 0444) Resp:  [10-20] 14 (08/14 0444) BP: (123-168)/(92-101) 147/95 (08/14 0444) SpO2:  [91 %-100 %] 100 % (08/14 0444) Last BM Date : 12/25/23  Intake/Output from previous day: 08/13 0701 - 08/14 0700 In: 2051.3 [I.V.:1221.3; Blood:630; IV Piggyback:200] Out: 175 [Urine:150; Blood:25] Intake/Output this shift: No intake/output data recorded.  Abd: honeycomb dressing with minimal drainage soft sore min distention   Lab Results:  Recent Labs    12/27/23 0534 12/28/23 0527  WBC 6.6 12.2*  HGB 7.8* 9.8*  HCT 27.2* 32.4*  PLT 481* 506*   BMET Recent Labs    12/27/23 0534 12/28/23 0527  NA 134* 132*  K 4.1 4.4  CL 100 98  CO2 25 27  GLUCOSE 90 108*  BUN 7 11  CREATININE 0.75 0.74  CALCIUM  8.8* 8.5*   PT/INR Recent Labs    12/26/23 1337  LABPROT 14.9  INR 1.1   ABG No results for input(s): PHART, HCO3 in the last 72 hours.  Invalid input(s): PCO2, PO2  Studies/Results: CT CHEST WO CONTRAST Result Date: 12/26/2023 CLINICAL DATA:  Cough short of breath, colon mass EXAM: CT CHEST WITHOUT CONTRAST TECHNIQUE: Multidetector CT imaging of the chest was performed following the standard protocol without IV contrast. RADIATION DOSE REDUCTION: This exam was performed according to the departmental dose-optimization program which includes automated exposure control, adjustment of the mA and/or kV according to patient size and/or use of iterative reconstruction technique. COMPARISON:  Chest x-ray 12/22/2023, CT abdomen pelvis 12/22/2023 FINDINGS: Cardiovascular: Limited evaluation without intravenous contrast. Mild aortic atherosclerosis. No aneurysm. Multi vessel coronary vascular calcification. Normal cardiac size. No pericardial  effusion Mediastinum/Nodes: Patent trachea. No thyroid mass. Subcentimeter mediastinal lymph nodes. Esophagus within normal limits. Lungs/Pleura: No acute airspace disease, pleural effusion or pneumothorax. No suspicious lung nodules. Minimal mucous plugging within left lower lobe bronchi, series 6, image 107. Upper Abdomen: No acute finding. Partially visualized large stone within the left renal pelvis Musculoskeletal: No acute osseous abnormality. Multilevel Schmorl's node with mild sclerosis. IMPRESSION: 1. No CT evidence for acute intrathoracic abnormality. No evidence for metastatic disease to the chest 2. Multi vessel coronary vascular disease. 3. Partially visualized large stone within the left renal pelvis. 4. Aortic atherosclerosis. Aortic Atherosclerosis (ICD10-I70.0). Electronically Signed   By: Luke Bun M.D.   On: 12/26/2023 16:31    Anti-infectives: Anti-infectives (From admission, onward)    Start     Dose/Rate Route Frequency Ordered Stop   12/27/23 0745  cefoTEtan  (CEFOTAN ) 2 g in sodium chloride  0.9 % 100 mL IVPB        2 g 200 mL/hr over 30 Minutes Intravenous On call to O.R. 12/27/23 9390 12/27/23 0913       Assessment/Plan: s/p Procedure(s) with comments: LAPAROTOMY, EXPLORATORY (N/A) - RIGHT COLECTOMY COLECTOMY, RIGHT (Right) Start clears  Ambulate   LOS: 6 days    Debby DELENA Shipper MD  12/28/2023

## 2023-12-28 NOTE — Progress Notes (Signed)
 Progress Note    Rodney [name]   FMW:[id number]  DOB: 1961/03/07  DOA: 12/22/2023     6 PCP: Rollene Almarie LABOR, MD  Initial CC: Abdominal pain  Hospital Course: Rodney [name] is a 63 y.o. male with medical history significant for alcohol abuse, drinks 1 pint of whiskey daily(last drink just prior to admission), current tobacco abuse, 50-pack-year history, who presented to the ER with worsening abdominal pain noted to be markedly anemic with positive FOBT.  CT imaging confirms large 10.3 cm mas adjacent to ascending colon.  Hospitalist called for admission, general surgery and GI called in consult.   Assessment & Plan:   Colonic adenocarcinoma Abdominal pain -10.3 cm mass adjacent to the ascending colon - s/p colonoscopy on 12/24/2023 with biopsies obtained; pathology has returned with invasive moderately differentiated adenocarcinoma - General Surgery following - Underwent right colectomy on 12/27/2023 - Also discussed with oncology; recommended for outpatient referral, they will set up appointment - No signs of metastatic disease on CT chest -Awaiting return of bowel function and diet advancement prior to discharge; CLD started 8/14  Acute symptomatic anemia Acute blood loss anemia on chronic iron  deficiency anemia versus chronic disease Thrombocytosis (? Reactive) Hemoglobin 6 g/dL initially with positive FOBT - Transfusing as needed - Currently hemoglobin stable, 9.8 g/dL this morning   Questionable cirrhosis Noted on CT, will need further testing to confirm  Appreciate GI recommendations   Alcohol abuse  1 pint liquor per day, last drink 12/22/2023 Patient is not interested in quitting. - no s/s w/d at this time; can d/c CIWA   Tobacco abuse 50-pack-year history Tobacco cessation counseling done at bedside; he's more interested in cessation at this time   Coronary artery atherosclerosis.  Aortic atherosclerosis. Denies any anginal symptoms. Continue to monitor on  telemetry   Nephrolithiasis - Dominant 3.8 cm stone within the left extra renal pelvis without significant obstruction, incidental finding.  Currently asymptomatic   Moderate protein caloric malnutrition  Hypomagnesemia Hypokalemia Likely secondary to poor p.o. intake, abdominal mass/cancer and alcohol abuse  -Replete as needed  Interval History:  Denies flatus or bowel movements but no nausea or vomiting. Being started on clear liquids today. Ambulating well in his room, does not wish to go in the hallway.  Old records reviewed in assessment of this patient  Antimicrobials:   DVT prophylaxis:  SCDs Start: 12/22/23 1936   Code Status:   Code Status: Full Code  Mobility Assessment (Last 72 Hours)     Mobility Assessment     Row Name 12/28/23 0709 12/27/23 1915 12/27/23 0715 12/26/23 1915 12/26/23 0853   Does the patient have exclusion criteria? No - Perform mobility assessment No - Perform mobility assessment No - Perform mobility assessment No - Perform mobility assessment No - Perform mobility assessment   What is the highest level of mobility based on the mobility assessment? Level 5 (Ambulates independently) - Balance while walking independently - Complete Level 5 (Ambulates independently) - Balance while walking independently - Complete Level 5 (Ambulates independently) - Balance while walking independently - Complete Level 5 (Ambulates independently) - Balance while walking independently - Complete Level 5 (Ambulates independently) - Balance while walking independently - Complete    Row Name 12/25/23 2000           Does the patient have exclusion criteria? No - Perform mobility assessment       What is the highest level of mobility based on the mobility assessment? Level 5 (Ambulates independently) -  Balance while walking independently - Complete          Barriers to discharge: None Disposition Plan: Home HH orders placed: N/A Status is:  Inpatient  Objective: Blood pressure (!) 130/92, pulse 76, temperature 97.6 F (36.4 C), temperature source Oral, resp. rate 20, height 5' 9 (1.753 m), weight 72.1 kg, SpO2 95%.  Examination:  Physical Exam Constitutional:      General: He is not in acute distress.    Appearance: Normal appearance.  HENT:     Head: Normocephalic and atraumatic.     Mouth/Throat:     Mouth: Mucous membranes are moist.  Eyes:     Extraocular Movements: Extraocular movements intact.  Cardiovascular:     Rate and Rhythm: Normal rate and regular rhythm.  Pulmonary:     Effort: Pulmonary effort is normal. No respiratory distress.     Breath sounds: Normal breath sounds. No wheezing.  Abdominal:     General: Bowel sounds are absent. There is no distension.     Palpations: Abdomen is soft.     Tenderness: There is no abdominal tenderness.     Comments: Midline abdominal incision noted with staples and honeycomb dressing in place  Musculoskeletal:        General: Normal range of motion.     Cervical back: Normal range of motion and neck supple.  Skin:    General: Skin is warm and dry.  Neurological:     General: No focal deficit present.     Mental Status: He is alert.  Psychiatric:        Mood and Affect: Mood normal.        Behavior: Behavior normal.      Consultants:  GI General surgery  Procedures:  12/24/2023: Colonoscopy 12/27/2023: Right colectomy  Data Reviewed: Results for orders placed or performed during the hospital encounter of 12/22/23 (from the past 24 hours)  CBC     Status: Abnormal   Collection Time: 12/28/23  5:27 AM  Result Value Ref Range   WBC 12.2 (H) 4.0 - 10.5 K/uL   RBC 4.03 (L) 4.22 - 5.81 MIL/uL   Hemoglobin 9.8 (L) 13.0 - 17.0 g/dL   HCT 67.5 (L) 60.9 - 47.9 %   MCV 80.4 80.0 - 100.0 fL   MCH 24.3 (L) 26.0 - 34.0 pg   MCHC 30.2 30.0 - 36.0 g/dL   RDW 79.3 (H) 88.4 - 84.4 %   Platelets 506 (H) 150 - 400 K/uL   nRBC 0.0 0.0 - 0.2 %  Comprehensive  metabolic panel with GFR     Status: Abnormal   Collection Time: 12/28/23  5:27 AM  Result Value Ref Range   Sodium 132 (L) 135 - 145 mmol/L   Potassium 4.4 3.5 - 5.1 mmol/L   Chloride 98 98 - 111 mmol/L   CO2 27 22 - 32 mmol/L   Glucose, Bld 108 (H) 70 - 99 mg/dL   BUN 11 6 - 20 mg/dL   Creatinine, Ser 9.25 0.61 - 1.24 mg/dL   Calcium  8.5 (L) 8.9 - 10.3 mg/dL   Total Protein 6.8 6.5 - 8.1 g/dL   Albumin 2.3 (L) 3.5 - 5.0 g/dL   AST 30 15 - 41 U/L   ALT 15 0 - 44 U/L   Alkaline Phosphatase 71 38 - 126 U/L   Total Bilirubin 0.7 0.0 - 1.2 mg/dL   GFR, Estimated >39 >39 mL/min   Anion gap 7 5 - 15    I  have reviewed pertinent nursing notes, vitals, labs, and images as necessary. I have ordered labwork to follow up on as indicated.  I have reviewed the last notes from staff over past 24 hours. I have discussed patient's care plan and test results with nursing staff, CM/SW, and other staff as appropriate.  Time spent: Greater than 50% of the 55 minute visit was spent in counseling/coordination of care for the patient as laid out in the A&P.   LOS: 6 days   Alm Apo, MD [facility] 12/28/2023, 2:14 PM

## 2023-12-29 MED ORDER — HEPARIN SODIUM (PORCINE) 5000 UNIT/ML IJ SOLN
5000.0000 [IU] | Freq: Three times a day (TID) | INTRAMUSCULAR | Status: DC
  Administered 2023-12-29 – 2024-01-02 (×12): 5000 [IU] via SUBCUTANEOUS
  Filled 2023-12-29 (×12): qty 1

## 2023-12-29 MED ORDER — OXYCODONE HCL 5 MG PO TABS
5.0000 mg | ORAL_TABLET | ORAL | Status: DC | PRN
  Administered 2023-12-29 – 2024-01-01 (×7): 5 mg via ORAL
  Filled 2023-12-29 (×7): qty 1

## 2023-12-29 NOTE — Plan of Care (Signed)

## 2023-12-29 NOTE — Progress Notes (Signed)
 Progress Note    Rodney [name]   FMW:[id number]  DOB: August 08, 1960  DOA: 12/22/2023     7 PCP: Rollene Almarie LABOR, MD  Initial CC: Abdominal pain  Hospital Course: Rodney [name] is a 63 y.o. male with medical history significant for alcohol abuse, drinks 1 pint of whiskey daily(last drink just prior to admission), current tobacco abuse, 50-pack-year history, who presented to the ER with worsening abdominal pain noted to be markedly anemic with positive FOBT.  CT imaging confirms large 10.3 cm mas adjacent to ascending colon.  Hospitalist called for admission, general surgery and GI called in consult.   Assessment & Plan:   Colonic adenocarcinoma Abdominal pain -10.3 cm mass adjacent to the ascending colon - s/p colonoscopy on 12/24/2023 with biopsies obtained; pathology has returned with invasive moderately differentiated adenocarcinoma - General Surgery following - Underwent right colectomy on 12/27/2023 - Also discussed with oncology; recommended for outpatient referral, they will set up appointment - No signs of metastatic disease on CT chest -Awaiting return of bowel function and diet advancement prior to discharge; CLD started 8/14 (tolerating)  Acute symptomatic anemia Acute blood loss anemia on chronic iron  deficiency anemia versus chronic disease Thrombocytosis (? Reactive) Hemoglobin 6 g/dL initially with positive FOBT - Transfusing as needed - Currently hemoglobin stable   Questionable cirrhosis Noted on CT, will need further testing to confirm  Appreciate GI recommendations   Alcohol abuse  1 pint liquor per day, last drink 12/22/2023 Patient is not interested in quitting. - no s/s w/d at this time; can d/c CIWA   Tobacco abuse 50-pack-year history Tobacco cessation counseling done at bedside; he's more interested in cessation at this time   Coronary artery atherosclerosis.  Aortic atherosclerosis. Denies any anginal symptoms. Continue to monitor on  telemetry   Nephrolithiasis - Dominant 3.8 cm stone within the left extra renal pelvis without significant obstruction, incidental finding.  Currently asymptomatic   Moderate protein caloric malnutrition  Hypomagnesemia Hypokalemia Likely secondary to poor p.o. intake, abdominal mass/cancer and alcohol abuse  -Replete as needed  Interval History:  No events overnight.  Ambulating in the room easily still. Has not yet achieved any flatus or bowel movement but tolerating clear liquids.  Old records reviewed in assessment of this patient  Antimicrobials:   DVT prophylaxis:  heparin  injection 5,000 Units Start: 12/29/23 0745 SCDs Start: 12/22/23 1936   Code Status:   Code Status: Full Code  Mobility Assessment (Last 72 Hours)     Mobility Assessment     Row Name 12/29/23 0939 12/28/23 1905 12/28/23 0709 12/27/23 1915 12/27/23 0715   Does the patient have exclusion criteria? No - Perform mobility assessment No - Perform mobility assessment No - Perform mobility assessment No - Perform mobility assessment No - Perform mobility assessment   What is the highest level of mobility based on the mobility assessment? Level 5 (Ambulates independently) - Balance while walking independently - Complete Level 5 (Ambulates independently) - Balance while walking independently - Complete Level 5 (Ambulates independently) - Balance while walking independently - Complete Level 5 (Ambulates independently) - Balance while walking independently - Complete Level 5 (Ambulates independently) - Balance while walking independently - Complete    Row Name 12/26/23 1915           Does the patient have exclusion criteria? No - Perform mobility assessment       What is the highest level of mobility based on the mobility assessment? Level 5 (Ambulates independently) - Balance  while walking independently - Complete          Barriers to discharge: None Disposition Plan: Home HH orders placed: N/A Status  is: Inpatient  Objective: Blood pressure (!) 141/108, pulse 84, temperature 98.5 F (36.9 C), resp. rate 20, height 5' 9 (1.753 m), weight 72.1 kg, SpO2 94%.  Examination:  Physical Exam Constitutional:      General: He is not in acute distress.    Appearance: Normal appearance.  HENT:     Head: Normocephalic and atraumatic.     Mouth/Throat:     Mouth: Mucous membranes are moist.  Eyes:     Extraocular Movements: Extraocular movements intact.  Cardiovascular:     Rate and Rhythm: Normal rate and regular rhythm.  Pulmonary:     Effort: Pulmonary effort is normal. No respiratory distress.     Breath sounds: Normal breath sounds. No wheezing.  Abdominal:     General: Bowel sounds are absent. There is no distension.     Palpations: Abdomen is soft.     Tenderness: There is no abdominal tenderness.     Comments: Midline abdominal incision noted with staples and honeycomb dressing in place  Musculoskeletal:        General: Normal range of motion.     Cervical back: Normal range of motion and neck supple.  Skin:    General: Skin is warm and dry.  Neurological:     General: No focal deficit present.     Mental Status: He is alert.  Psychiatric:        Mood and Affect: Mood normal.        Behavior: Behavior normal.      Consultants:  GI General surgery  Procedures:  12/24/2023: Colonoscopy 12/27/2023: Right colectomy  Data Reviewed: No results found for this or any previous visit (from the past 24 hours).   I have reviewed pertinent nursing notes, vitals, labs, and images as necessary. I have ordered labwork to follow up on as indicated.  I have reviewed the last notes from staff over past 24 hours. I have discussed patient's care plan and test results with nursing staff, CM/SW, and other staff as appropriate.  Time spent: Greater than 50% of the 55 minute visit was spent in counseling/coordination of care for the patient as laid out in the A&P.   LOS: 7 days    Alm Apo, MD [facility] 12/29/2023, 4:27 PM

## 2023-12-29 NOTE — Plan of Care (Signed)
   Problem: Education: Goal: Knowledge of General Education information will improve Description: Including pain rating scale, medication(s)/side effects and non-pharmacologic comfort measures Outcome: Progressing   Problem: Pain Managment: Goal: General experience of comfort will improve and/or be controlled Outcome: Progressing   Problem: Safety: Goal: Ability to remain free from injury will improve Outcome: Progressing

## 2023-12-29 NOTE — Progress Notes (Signed)
 2 Days Post-Op   Subjective/Chief Complaint: Doing fine, no flatus yet   Objective: Vital signs in last 24 hours: Temp:  [97.6 F (36.4 C)-98 F (36.7 C)] 98 F (36.7 C) (08/15 0435) Pulse Rate:  [74-84] 74 (08/15 0435) Resp:  [16-20] 16 (08/15 0435) BP: (130-150)/(92-100) 150/100 (08/15 0435) SpO2:  [92 %-95 %] 94 % (08/15 0435) Last BM Date : 12/25/23  Intake/Output from previous day: No intake/output data recorded. Intake/Output this shift: No intake/output data recorded.  Ab dressing minimal drainage, soft approp tender  Lab Results:  Recent Labs    12/27/23 0534 12/28/23 0527  WBC 6.6 12.2*  HGB 7.8* 9.8*  HCT 27.2* 32.4*  PLT 481* 506*   BMET Recent Labs    12/27/23 0534 12/28/23 0527  NA 134* 132*  K 4.1 4.4  CL 100 98  CO2 25 27  GLUCOSE 90 108*  BUN 7 11  CREATININE 0.75 0.74  CALCIUM  8.8* 8.5*   PT/INR Recent Labs    12/26/23 1337  LABPROT 14.9  INR 1.1   ABG No results for input(s): PHART, HCO3 in the last 72 hours.  Invalid input(s): PCO2, PO2  Studies/Results: No results found.  Anti-infectives: Anti-infectives (From admission, onward)    Start     Dose/Rate Route Frequency Ordered Stop   12/27/23 0745  cefoTEtan  (CEFOTAN ) 2 g in sodium chloride  0.9 % 100 mL IVPB        2 g 200 mL/hr over 30 Minutes Intravenous On call to O.R. 12/27/23 0609 12/27/23 0913       Assessment/Plan: POD 2 right colon -continue clears until bowel function -path pending- I have reached out to Dr Lanny and he will see her after dc -pulm toilet, ambulate -follow labs -sq heparin   Rodney [name] 12/29/2023

## 2023-12-30 NOTE — Progress Notes (Signed)
 Progress Note    Rodney [name]   FMW:[id number]  DOB: Aug 13, 1960  DOA: 12/22/2023     8 PCP: Rollene Almarie LABOR, MD  Initial CC: Abdominal pain  Hospital Course: DOMNICK CHERVENAK is a 63 y.o. male with medical history significant for alcohol abuse, drinks 1 pint of whiskey daily(last drink just prior to admission), current tobacco abuse, 50-pack-year history, who presented to the ER with worsening abdominal pain noted to be markedly anemic with positive FOBT.  CT imaging confirms large 10.3 cm mas adjacent to ascending colon.  Hospitalist called for admission, general surgery and GI called in consult.   Assessment & Plan:   Colonic adenocarcinoma Abdominal pain -10.3 cm mass adjacent to the ascending colon - s/p colonoscopy on 12/24/2023 with biopsies obtained; pathology has returned with invasive moderately differentiated adenocarcinoma - General Surgery following - Underwent right colectomy on 12/27/2023 - Also discussed with oncology; recommended for outpatient referral, they will set up appointment - No signs of metastatic disease on CT chest -Awaiting return of bowel function and diet advancement prior to discharge; CLD started 8/14 (tolerating)  Acute symptomatic anemia Acute blood loss anemia on chronic iron  deficiency anemia versus chronic disease Thrombocytosis (? Reactive) Hemoglobin 6 g/dL initially with positive FOBT - Transfusing as needed - Currently hemoglobin stable   Questionable cirrhosis Noted on CT, will need further testing to confirm  Appreciate GI recommendations   Alcohol abuse  1 pint liquor per day, last drink 12/22/2023 Patient is not interested in quitting. - no s/s w/d at this time; can d/c CIWA   Tobacco abuse 50-pack-year history Tobacco cessation counseling done at bedside; he's more interested in cessation at this time   Coronary artery atherosclerosis.  Aortic atherosclerosis. Denies any anginal symptoms. Continue to monitor on  telemetry   Nephrolithiasis - Dominant 3.8 cm stone within the left extra renal pelvis without significant obstruction, incidental finding.  Currently asymptomatic   Moderate protein caloric malnutrition  Hypomagnesemia Hypokalemia Likely secondary to poor p.o. intake, abdominal mass/cancer and alcohol abuse  -Replete as needed  Interval History:  No events overnight.  Ambulating in the room easily still. Has not yet achieved any flatus or bowel movement but tolerating clear liquids. No other issues, otherwise.   Old records reviewed in assessment of this patient  Antimicrobials:   DVT prophylaxis:  heparin  injection 5,000 Units Start: 12/29/23 0745 SCDs Start: 12/22/23 1936   Code Status:   Code Status: Full Code  Mobility Assessment (Last 72 Hours)     Mobility Assessment     Row Name 12/29/23 1928 12/29/23 0939 12/28/23 1905 12/28/23 0709 12/27/23 1915   Does the patient have exclusion criteria? No - Perform mobility assessment No - Perform mobility assessment No - Perform mobility assessment No - Perform mobility assessment No - Perform mobility assessment   What is the highest level of mobility based on the mobility assessment? Level 5 (Ambulates independently) - Balance while walking independently - Complete Level 5 (Ambulates independently) - Balance while walking independently - Complete Level 5 (Ambulates independently) - Balance while walking independently - Complete Level 5 (Ambulates independently) - Balance while walking independently - Complete Level 5 (Ambulates independently) - Balance while walking independently - Complete      Barriers to discharge: None Disposition Plan: Home HH orders placed: N/A Status is: Inpatient  Objective: Blood pressure (!) 141/97, pulse 81, temperature 98.4 F (36.9 C), temperature source Oral, resp. rate 20, height 5' 9 (1.753 m), weight 72.1 kg, SpO2  94%.  Examination:  Physical Exam Constitutional:      General: He is  not in acute distress.    Appearance: Normal appearance.  HENT:     Head: Normocephalic and atraumatic.     Mouth/Throat:     Mouth: Mucous membranes are moist.  Eyes:     Extraocular Movements: Extraocular movements intact.  Cardiovascular:     Rate and Rhythm: Normal rate and regular rhythm.  Pulmonary:     Effort: Pulmonary effort is normal. No respiratory distress.     Breath sounds: Normal breath sounds. No wheezing.  Abdominal:     General: Bowel sounds are absent. There is no distension.     Palpations: Abdomen is soft.     Tenderness: There is no abdominal tenderness.     Comments: Midline abdominal incision noted with staples and honeycomb dressing in place  Musculoskeletal:        General: Normal range of motion.     Cervical back: Normal range of motion and neck supple.  Skin:    General: Skin is warm and dry.  Neurological:     General: No focal deficit present.     Mental Status: He is alert.  Psychiatric:        Mood and Affect: Mood normal.        Behavior: Behavior normal.      Consultants:  GI General surgery  Procedures:  12/24/2023: Colonoscopy 12/27/2023: Right colectomy  Data Reviewed: No results found for this or any previous visit (from the past 24 hours).   I have reviewed pertinent nursing notes, vitals, labs, and images as necessary. I have ordered labwork to follow up on as indicated.  I have reviewed the last notes from staff over past 24 hours. I have discussed patient's care plan and test results with nursing staff, CM/SW, and other staff as appropriate.  Time spent: Greater than 50% of the 55 minute visit was spent in counseling/coordination of care for the patient as laid out in the A&P.   LOS: 8 days   Alm Apo, MD [facility] 12/30/2023, 3:15 PM

## 2023-12-30 NOTE — Plan of Care (Signed)
  Problem: Education: Goal: Knowledge of General Education information will improve Description: Including pain rating scale, medication(s)/side effects and non-pharmacologic comfort measures Outcome: Progressing   Problem: Health Behavior/Discharge Planning: Goal: Ability to manage health-related needs will improve Outcome: Progressing   Problem: Clinical Measurements: Goal: Ability to maintain clinical measurements within normal limits will improve Outcome: Progressing Goal: Will remain free from infection Outcome: Progressing Goal: Diagnostic test results will improve Outcome: Progressing Goal: Respiratory complications will improve Outcome: Progressing Goal: Cardiovascular complication will be avoided Outcome: Progressing   Problem: Activity: Goal: Risk for activity intolerance will decrease Outcome: Progressing   Problem: Coping: Goal: Level of anxiety will decrease Outcome: Progressing   Problem: Nutrition: Goal: Adequate nutrition will be maintained Outcome: Progressing   Problem: Elimination: Goal: Will not experience complications related to bowel motility Outcome: Progressing Goal: Will not experience complications related to urinary retention Outcome: Progressing   Problem: Pain Managment: Goal: General experience of comfort will improve and/or be controlled Outcome: Progressing   Problem: Safety: Goal: Ability to remain free from injury will improve Outcome: Progressing

## 2023-12-30 NOTE — Plan of Care (Signed)

## 2023-12-30 NOTE — Progress Notes (Signed)
 3 Days Post-Op   Subjective/Chief Complaint: No flatus or BM yet Does not feel distended or nauseated Path report - T3N0    Objective: Vital signs in last 24 hours: Temp:  [98.3 F (36.8 C)-98.9 F (37.2 C)] 98.3 F (36.8 C) (08/16 0616) Pulse Rate:  [83-84] 83 (08/16 0616) Resp:  [20] 20 (08/16 0616) BP: (141-154)/(104-108) 154/104 (08/16 0616) SpO2:  [90 %-94 %] 90 % (08/16 0616) Last BM Date : 12/25/23  Intake/Output from previous day: 08/15 0701 - 08/16 0700 In: 640 [P.O.:640] Out: -  Intake/Output this shift: No intake/output data recorded.  Abd - soft, non-distended Incision - minimal drainage on honeycomb  Lab Results:  Recent Labs    12/28/23 0527  WBC 12.2*  HGB 9.8*  HCT 32.4*  PLT 506*   BMET Recent Labs    12/28/23 0527  NA 132*  K 4.4  CL 98  CO2 27  GLUCOSE 108*  BUN 11  CREATININE 0.74  CALCIUM  8.5*   PT/INR No results for input(s): LABPROT, INR in the last 72 hours. ABG No results for input(s): PHART, HCO3 in the last 72 hours.  Invalid input(s): PCO2, PO2  Studies/Results: No results found.  Anti-infectives: Anti-infectives (From admission, onward)    Start     Dose/Rate Route Frequency Ordered Stop   12/27/23 0745  cefoTEtan  (CEFOTAN ) 2 g in sodium chloride  0.9 % 100 mL IVPB        2 g 200 mL/hr over 30 Minutes Intravenous On call to O.R. 12/27/23 0609 12/27/23 0913       Assessment/Plan: Right colon adenocarcinoma - T3N0 S/p right hemicolectomy - Dr. Ebbie 12/27/23  Awaiting return of bowel function Continue clear liquids Encourage ambulation SQ heparin   LOS: 8 days    Rodney [name] 12/30/2023

## 2023-12-31 LAB — BASIC METABOLIC PANEL WITH GFR
Anion gap: 9 (ref 5–15)
BUN: 6 mg/dL (ref 6–20)
CO2: 25 mmol/L (ref 22–32)
Calcium: 8.8 mg/dL — ABNORMAL LOW (ref 8.9–10.3)
Chloride: 98 mmol/L (ref 98–111)
Creatinine, Ser: 0.72 mg/dL (ref 0.61–1.24)
GFR, Estimated: >60 mL/min (ref >60)
Glucose, Bld: 86 mg/dL (ref 70–99)
Potassium: 4 mmol/L (ref 3.5–5.1)
Sodium: 132 mmol/L — ABNORMAL LOW (ref 135–145)

## 2023-12-31 LAB — CBC
HCT: 32.6 % — ABNORMAL LOW (ref 39.0–52.0)
Hemoglobin: 9.7 g/dL — ABNORMAL LOW (ref 13.0–17.0)
MCH: 24.4 pg — ABNORMAL LOW (ref 26.0–34.0)
MCHC: 29.8 g/dL — ABNORMAL LOW (ref 30.0–36.0)
MCV: 81.9 fL (ref 80.0–100.0)
Platelets: 534 K/uL — ABNORMAL HIGH (ref 150–400)
RBC: 3.98 MIL/uL — ABNORMAL LOW (ref 4.22–5.81)
RDW: 21 % — ABNORMAL HIGH (ref 11.5–15.5)
WBC: 7.2 K/uL (ref 4.0–10.5)
nRBC: 0 % (ref 0.0–0.2)

## 2023-12-31 MED ORDER — BISACODYL 10 MG RE SUPP
10.0000 mg | Freq: Once | RECTAL | Status: AC
  Administered 2023-12-31: 10 mg via RECTAL
  Filled 2023-12-31: qty 1

## 2023-12-31 NOTE — Progress Notes (Signed)
 Progress Note    Rodney [name]   FMW:[id number]  DOB: 07-05-1960  DOA: 12/22/2023     9 PCP: Rollene Almarie LABOR, MD  Initial CC: Abdominal pain  Hospital Course: Rodney [name] is a 63 y.o. male with medical history significant for alcohol abuse, drinks 1 pint of whiskey daily(last drink just prior to admission), current tobacco abuse, 50-pack-year history, who presented to the ER with worsening abdominal pain noted to be markedly anemic with positive FOBT.  CT imaging confirms large 10.3 cm mas adjacent to ascending colon.  Hospitalist called for admission, general surgery and GI called in consult.   Assessment & Plan:   Colonic adenocarcinoma Abdominal pain -10.3 cm mass adjacent to the ascending colon - s/p colonoscopy on 12/24/2023 with biopsies obtained; pathology has returned with invasive moderately differentiated adenocarcinoma - General Surgery following - Underwent right colectomy on 12/27/2023 - Also discussed with oncology; recommended for outpatient referral, they will set up appointment - No signs of metastatic disease on CT chest -Awaiting return of bowel function and diet advancement prior to discharge; CLD started 8/14 (tolerating)  Acute symptomatic anemia Acute blood loss anemia on chronic iron  deficiency anemia versus chronic disease Thrombocytosis (? Reactive) Hemoglobin 6 g/dL initially with positive FOBT - Transfusing as needed - Currently hemoglobin stable   Questionable cirrhosis Noted on CT, will need further testing to confirm  Appreciate GI recommendations   Alcohol abuse  1 pint liquor per day, last drink 12/22/2023 - no s/s w/d at this time; can d/c CIWA   Tobacco abuse 50-pack-year history Tobacco cessation counseling done at bedside; he's more interested in cessation at this time   Coronary artery atherosclerosis.  Aortic atherosclerosis. Denies any anginal symptoms. Continue to monitor on telemetry   Nephrolithiasis - Dominant 3.8 cm  stone within the left extra renal pelvis without significant obstruction, incidental finding.  Currently asymptomatic   Moderate protein caloric malnutrition  Hypomagnesemia Hypokalemia Likely secondary to poor p.o. intake, abdominal mass/cancer and alcohol abuse  -Replete as needed  Interval History:  No events overnight. Bowel sounds seem to have improved/returned but no flatus or BM yet. But hopefully soon. Still tolerating the clear liquid diet and ambulating around the room easily.  Old records reviewed in assessment of this patient  Antimicrobials:   DVT prophylaxis:  heparin  injection 5,000 Units Start: 12/29/23 0745 SCDs Start: 12/22/23 1936   Code Status:   Code Status: Full Code  Mobility Assessment (Last 72 Hours)     Mobility Assessment     Row Name 12/31/23 0830 12/30/23 1930 12/30/23 0825 12/29/23 1928 12/29/23 0939   Does the patient have exclusion criteria? No - Perform mobility assessment No - Perform mobility assessment No - Perform mobility assessment No - Perform mobility assessment No - Perform mobility assessment   What is the highest level of mobility based on the mobility assessment? Level 5 (Ambulates independently) - Balance while walking independently - Complete Level 5 (Ambulates independently) - Balance while walking independently - Complete Level 5 (Ambulates independently) - Balance while walking independently - Complete Level 5 (Ambulates independently) - Balance while walking independently - Complete Level 5 (Ambulates independently) - Balance while walking independently - Complete    Row Name 12/28/23 1905           Does the patient have exclusion criteria? No - Perform mobility assessment       What is the highest level of mobility based on the mobility assessment? Level 5 (Ambulates independently) -  Balance while walking independently - Complete          Barriers to discharge: None Disposition Plan: Home HH orders placed: N/A Status is:  Inpatient  Objective: Blood pressure 134/84, pulse 77, temperature 97.8 F (36.6 C), temperature source Oral, resp. rate 20, height 5' 9 (1.753 m), weight 72.1 kg, SpO2 97%.  Examination:  Physical Exam Constitutional:      General: He is not in acute distress.    Appearance: Normal appearance.  HENT:     Head: Normocephalic and atraumatic.     Mouth/Throat:     Mouth: Mucous membranes are moist.  Eyes:     Extraocular Movements: Extraocular movements intact.  Cardiovascular:     Rate and Rhythm: Normal rate and regular rhythm.  Pulmonary:     Effort: Pulmonary effort is normal. No respiratory distress.     Breath sounds: Normal breath sounds. No wheezing.  Abdominal:     General: Bowel sounds are normal. There is no distension.     Palpations: Abdomen is soft.     Tenderness: There is no abdominal tenderness.     Comments: Midline abdominal incision noted with staples and honeycomb dressing in place  Musculoskeletal:        General: Normal range of motion.     Cervical back: Normal range of motion and neck supple.  Skin:    General: Skin is warm and dry.  Neurological:     General: No focal deficit present.     Mental Status: He is alert.  Psychiatric:        Mood and Affect: Mood normal.        Behavior: Behavior normal.      Consultants:  GI General surgery  Procedures:  12/24/2023: Colonoscopy 12/27/2023: Right colectomy  Data Reviewed: Results for orders placed or performed during the hospital encounter of 12/22/23 (from the past 24 hours)  CBC     Status: Abnormal   Collection Time: 12/31/23  6:58 AM  Result Value Ref Range   WBC 7.2 4.0 - 10.5 K/uL   RBC 3.98 (L) 4.22 - 5.81 MIL/uL   Hemoglobin 9.7 (L) 13.0 - 17.0 g/dL   HCT 67.3 (L) 60.9 - 47.9 %   MCV 81.9 80.0 - 100.0 fL   MCH 24.4 (L) 26.0 - 34.0 pg   MCHC 29.8 (L) 30.0 - 36.0 g/dL   RDW 78.9 (H) 88.4 - 84.4 %   Platelets 534 (H) 150 - 400 K/uL   nRBC 0.0 0.0 - 0.2 %  Basic metabolic panel      Status: Abnormal   Collection Time: 12/31/23  6:58 AM  Result Value Ref Range   Sodium 132 (L) 135 - 145 mmol/L   Potassium 4.0 3.5 - 5.1 mmol/L   Chloride 98 98 - 111 mmol/L   CO2 25 22 - 32 mmol/L   Glucose, Bld 86 70 - 99 mg/dL   BUN 6 6 - 20 mg/dL   Creatinine, Ser 9.27 0.61 - 1.24 mg/dL   Calcium  8.8 (L) 8.9 - 10.3 mg/dL   GFR, Estimated >39 >39 mL/min   Anion gap 9 5 - 15     I have reviewed pertinent nursing notes, vitals, labs, and images as necessary. I have ordered labwork to follow up on as indicated.  I have reviewed the last notes from staff over past 24 hours. I have discussed patient's care plan and test results with nursing staff, CM/SW, and other staff as appropriate.  Time spent:  Greater than 50% of the 55 minute visit was spent in counseling/coordination of care for the patient as laid out in the A&P.   LOS: 9 days   Alm Apo, MD [facility] 12/31/2023, 1:52 PM

## 2023-12-31 NOTE — Progress Notes (Signed)
 This nurse met the patient about to take a shower and asked him to hold on to confirm with his surgeon. Patient complied, Dr Gwenn informed, order in.  Returned later with some materials to protect the IV lines and the midline surgical incision but patient declined and stated that he was not going to go through all this just to have a shower. Patient educated on the risk of infection and instructed to call whenever he is ready for the shower. Will continue to follow.

## 2023-12-31 NOTE — Plan of Care (Signed)

## 2023-12-31 NOTE — Plan of Care (Signed)

## 2023-12-31 NOTE — Progress Notes (Signed)
 4 Days Post-Op   Subjective/Chief Complaint: No flatus or BM reported, but he does not feel distended No nausea or vomiting   Objective: Vital signs in last 24 hours: Temp:  [98 F (36.7 C)-98.4 F (36.9 C)] 98.2 F (36.8 C) (08/17 0512) Pulse Rate:  [70-85] 70 (08/17 0512) Resp:  [16-20] 16 (08/17 0512) BP: (140-147)/(92-97) 147/92 (08/17 0512) SpO2:  [94 %-97 %] 97 % (08/17 0512) Last BM Date : 12/25/23   Abd - soft, non-distended Minimal tenderness Incision - c/d/I through honeycomb  Lab Results:  Recent Labs    12/31/23 0658  WBC 7.2  HGB 9.7*  HCT 32.6*  PLT 534*   BMET Recent Labs    12/31/23 0658  NA 132*  K 4.0  CL 98  CO2 25  GLUCOSE 86  BUN 6  CREATININE 0.72  CALCIUM  8.8*   PT/INR No results for input(s): LABPROT, INR in the last 72 hours. ABG No results for input(s): PHART, HCO3 in the last 72 hours.  Invalid input(s): PCO2, PO2  Studies/Results: No results found.  Anti-infectives: Anti-infectives (From admission, onward)    Start     Dose/Rate Route Frequency Ordered Stop   12/27/23 0745  cefoTEtan  (CEFOTAN ) 2 g in sodium chloride  0.9 % 100 mL IVPB        2 g 200 mL/hr over 30 Minutes Intravenous On call to O.R. 12/27/23 0609 12/27/23 0913       Assessment/Plan: Right colon adenocarcinoma - T3N0 S/p right hemicolectomy - Dr. Ebbie 12/27/23   Awaiting return of bowel function One Dulcolax suppository today Continue clear liquids Encourage ambulation SQ heparin   LOS: 9 days    Donnice MARLA Lima 12/31/2023

## 2024-01-01 DIAGNOSIS — D509 Iron deficiency anemia, unspecified: Secondary | ICD-10-CM

## 2024-01-01 LAB — SURGICAL PATHOLOGY

## 2024-01-01 MED ORDER — METHOCARBAMOL 500 MG PO TABS
500.0000 mg | ORAL_TABLET | Freq: Three times a day (TID) | ORAL | Status: DC
  Administered 2024-01-01 – 2024-01-02 (×4): 500 mg via ORAL
  Filled 2024-01-01 (×4): qty 1

## 2024-01-01 MED ORDER — OXYCODONE HCL 5 MG PO TABS
5.0000 mg | ORAL_TABLET | ORAL | Status: DC | PRN
  Administered 2024-01-01: 5 mg via ORAL
  Administered 2024-01-01: 10 mg via ORAL
  Filled 2024-01-01: qty 2
  Filled 2024-01-01: qty 1

## 2024-01-01 MED ORDER — HYDROMORPHONE HCL 1 MG/ML IJ SOLN: 0.5000 mg | INTRAMUSCULAR | Status: DC | PRN

## 2024-01-01 MED ORDER — ACETAMINOPHEN 325 MG PO TABS
650.0000 mg | ORAL_TABLET | Freq: Four times a day (QID) | ORAL | Status: DC
  Administered 2024-01-01 – 2024-01-02 (×4): 650 mg via ORAL
  Filled 2024-01-01 (×4): qty 2

## 2024-01-01 NOTE — Plan of Care (Signed)
 Pt reported having a bowel movement yesterday. Pt educated to ambulate when possible to help with elimination. Pt stated pain is improving with current pain regimen. Call light within reach   Problem: Education: Goal: Knowledge of General Education information will improve Description: Including pain rating scale, medication(s)/side effects and non-pharmacologic comfort measures Outcome: Progressing   Problem: Activity: Goal: Risk for activity intolerance will decrease Outcome: Progressing   Problem: Elimination: Goal: Will not experience complications related to bowel motility Outcome: Progressing   Problem: Pain Managment: Goal: General experience of comfort will improve and/or be controlled Outcome: Progressing   Problem: Safety: Goal: Ability to remain free from injury will improve Outcome: Progressing

## 2024-01-01 NOTE — Plan of Care (Signed)

## 2024-01-01 NOTE — Progress Notes (Signed)
 Progress Note    MAVERIC DEBONO   FMW:[id number]  DOB: [birth year]  DOA: 12/22/2023     10 PCP: Rollene Almarie LABOR, MD  Initial CC: Abdominal pain  Hospital Course: Rodney [name] is a 63 y.o. male with medical history significant for alcohol abuse, drinks 1 pint of whiskey daily(last drink just prior to admission), current tobacco abuse, 50-pack-year history, who presented to the ER with worsening abdominal pain noted to be markedly anemic with positive FOBT.  CT imaging confirms large 10.3 cm mas adjacent to ascending colon.  Hospitalist called for admission, general surgery and GI called in consult.   Assessment & Plan:   Colonic adenocarcinoma Abdominal pain -10.3 cm mass adjacent to the ascending colon - s/p colonoscopy on 12/24/2023 with biopsies obtained; pathology has returned with invasive moderately differentiated adenocarcinoma - General Surgery following - Underwent right colectomy on 12/27/2023 - Also discussed with oncology; recommended for outpatient referral, they will set up appointment - No signs of metastatic disease on CT chest - Patient developed bowel movements evening of 12/31/2023 -Still feeling improved today and clear liquids advanced to soft diet per surgery  Acute symptomatic anemia - resolved  Acute blood loss anemia on chronic iron  deficiency anemia versus chronic disease Thrombocytosis Hemoglobin 6 g/dL initially with positive FOBT - Transfusing as needed - Currently hemoglobin stable; thrombocytosis stable and presumed reactive   Questionable cirrhosis Noted on CT, will need further testing to confirm  Appreciate GI recommendations   Alcohol abuse  1 pint liquor per day, last drink 12/22/2023 - no s/s w/d at this time; can d/c CIWA   Tobacco abuse 50-pack-year history Tobacco cessation counseling done at bedside; he's more interested in cessation at this time   Coronary artery atherosclerosis.  Aortic atherosclerosis. Denies any anginal  symptoms. Continue to monitor on telemetry   Nephrolithiasis - Dominant 3.8 cm stone within the left extra renal pelvis without significant obstruction, incidental finding.  Currently asymptomatic   Moderate protein caloric malnutrition  Hypomagnesemia Hypokalemia Likely secondary to poor p.o. intake, abdominal mass/cancer and alcohol abuse  -Replete as needed  Interval History:  Began having bowel movements last night.  Still feeling okay today.  Amenable with diet advancement.  Diet advanced per surgery to soft diet today.  Old records reviewed in assessment of this patient  Antimicrobials:   DVT prophylaxis:  heparin  injection 5,000 Units Start: 12/29/23 0745 SCDs Start: 12/22/23 1936   Code Status:   Code Status: Full Code  Mobility Assessment (Last 72 Hours)     Mobility Assessment     Row Name 01/01/24 1054 12/31/23 1946 12/31/23 0830 12/30/23 1930 12/30/23 0825   Does the patient have exclusion criteria? No - Perform mobility assessment No - Perform mobility assessment No - Perform mobility assessment No - Perform mobility assessment No - Perform mobility assessment   What is the highest level of mobility based on the mobility assessment? Level 5 (Ambulates independently) - Balance while walking independently - Complete Level 5 (Ambulates independently) - Balance while walking independently - Complete Level 5 (Ambulates independently) - Balance while walking independently - Complete Level 5 (Ambulates independently) - Balance while walking independently - Complete Level 5 (Ambulates independently) - Balance while walking independently - Complete    Row Name 12/29/23 1928           Does the patient have exclusion criteria? No - Perform mobility assessment       What is the highest level of mobility based on the  mobility assessment? Level 5 (Ambulates independently) - Balance while walking independently - Complete          Barriers to discharge: None Disposition  Plan: Home HH orders placed: N/A Status is: Inpatient  Objective: Blood pressure (!) 139/96, pulse 87, temperature 98.7 F (37.1 C), resp. rate 16, height 5' 9 (1.753 m), weight 72.1 kg, SpO2 98%.  Examination:  Physical Exam Constitutional:      General: He is not in acute distress.    Appearance: Normal appearance.  HENT:     Head: Normocephalic and atraumatic.     Mouth/Throat:     Mouth: Mucous membranes are moist.  Eyes:     Extraocular Movements: Extraocular movements intact.  Cardiovascular:     Rate and Rhythm: Normal rate and regular rhythm.  Pulmonary:     Effort: Pulmonary effort is normal. No respiratory distress.     Breath sounds: Normal breath sounds. No wheezing.  Abdominal:     General: Bowel sounds are normal. There is no distension.     Palpations: Abdomen is soft.     Tenderness: There is no abdominal tenderness.     Comments: Midline abdominal incision noted with staples and honeycomb dressing in place  Musculoskeletal:        General: Normal range of motion.     Cervical back: Normal range of motion and neck supple.  Skin:    General: Skin is warm and dry.  Neurological:     General: No focal deficit present.     Mental Status: He is alert.  Psychiatric:        Mood and Affect: Mood normal.        Behavior: Behavior normal.      Consultants:  GI General surgery  Procedures:  12/24/2023: Colonoscopy 12/27/2023: Right colectomy  Data Reviewed: No results found for this or any previous visit (from the past 24 hours).    I have reviewed pertinent nursing notes, vitals, labs, and images as necessary. I have ordered labwork to follow up on as indicated.  I have reviewed the last notes from staff over past 24 hours. I have discussed patient's care plan and test results with nursing staff, CM/SW, and other staff as appropriate.  Time spent: Greater than 50% of the 55 minute visit was spent in counseling/coordination of care for the patient as  laid out in the A&P.   LOS: 10 days   Alm Apo, MD [facility] 01/01/2024, 3:10 PM

## 2024-01-01 NOTE — Progress Notes (Signed)
   Progress Note  5 Days Post-Op  Subjective: Pt reports stable incisional abdominal pain. Ambulating in room and reports not feeling comfortable with walking out in the hall. Had a large liquid BM after suppository yesterday. Passing flatus. Denies nausea or vomiting.   Objective: Vital signs in last 24 hours: Temp:  [97.8 F (36.6 C)-98.9 F (37.2 C)] 98.5 F (36.9 C) (08/18 0843) Pulse Rate:  [57-86] 86 (08/18 0843) Resp:  [16-20] 16 (08/18 0843) BP: (131-152)/(84-94) 131/93 (08/18 0843) SpO2:  [94 %-97 %] 94 % (08/18 0843) Last BM Date : 12/31/23  Intake/Output from previous day: 08/17 0701 - 08/18 0700 In: 480 [P.O.:480] Out: -  Intake/Output this shift: No intake/output data recorded.  PE: General: pleasant, WD, thin male who is laying in bed in NAD Heart: regular, rate, and rhythm.   Lungs: Respiratory effort nonlabored Abd: soft, mild distention, appropriately ttp, incision C/D/I with staples present  Psych: A&Ox3 with an appropriate affect.    Lab Results:  Recent Labs    12/31/23 0658  WBC 7.2  HGB 9.7*  HCT 32.6*  PLT 534*   BMET Recent Labs    12/31/23 0658  NA 132*  K 4.0  CL 98  CO2 25  GLUCOSE 86  BUN 6  CREATININE 0.72  CALCIUM  8.8*   PT/INR No results for input(s): LABPROT, INR in the last 72 hours. CMP     Component Value Date/Time   NA 132 (L) 12/31/2023 0658   K 4.0 12/31/2023 0658   CL 98 12/31/2023 0658   CO2 25 12/31/2023 0658   GLUCOSE 86 12/31/2023 0658   GLUCOSE 99 05/29/2006 1005   BUN 6 12/31/2023 0658   CREATININE 0.72 12/31/2023 0658   CALCIUM  8.8 (L) 12/31/2023 0658   PROT 6.8 12/28/2023 0527   ALBUMIN 2.3 (L) 12/28/2023 0527   AST 30 12/28/2023 0527   ALT 15 12/28/2023 0527   ALKPHOS 71 12/28/2023 0527   BILITOT 0.7 12/28/2023 0527   GFRNONAA >60 12/31/2023 0658   Lipase     Component Value Date/Time   LIPASE 36 12/22/2023 1514       Studies/Results: No results  found.  Anti-infectives: Anti-infectives (From admission, onward)    Start     Dose/Rate Route Frequency Ordered Stop   12/27/23 0745  cefoTEtan  (CEFOTAN ) 2 g in sodium chloride  0.9 % 100 mL IVPB        2 g 200 mL/hr over 30 Minutes Intravenous On call to O.R. 12/27/23 0609 12/27/23 0913        Assessment/Plan Right colon adenocarcinoma - T3N0 POD5 s/p right hemicolectomy Dr. Ebbie - having bowel function - advance to soft diet today  - encouraged mobilization  - ok to shower - adjusted pain meds today - may be ready for DC as early as tomorrow from surgical standpoint if tolerating soft diet and having bowel function  - will start working on surgical follow up for staples and post-op    FEN: Soft diet, IVF per [facility] VTE: SQ[facility] ID: [email or web address]  pre-op  - per [facility] -  ABL anemia on Chronic IDA - hgb  ?Cirrhosis  Alcohol abuse Tobacco abuse CAD without angina  Nephrolithiasis, nonobstructive Moderate protein calorie malnutrition    LOS: 10 days     Burnard JONELLE Louder, [facility] 01/01/2024, 10:12 AM Please see Amion for pager number during day hours 7:00am-4:30pm

## 2024-01-01 NOTE — Discharge Instructions (Signed)
[facility]      [facility] [phone]  OPEN ABDOMINAL SURGERY: POST OP INSTRUCTIONS  Always review your discharge instruction sheet given to you by the facility where your surgery was performed.  IF YOU HAVE DISABILITY OR FAMILY LEAVE FORMS, YOU MUST BRING THEM TO THE OFFICE FOR PROCESSING.  PLEASE DO NOT GIVE THEM TO YOUR DOCTOR.  A prescription for pain medication may be given to you upon discharge.  Take your pain medication as prescribed, if needed.  If narcotic pain medicine is not needed, then you may take acetaminophen (Tylenol) or ibuprofen (Advil) as needed. Take your usually prescribed medications unless otherwise directed. If you need a refill on your pain medication, please contact your pharmacy. They will contact our office to request authorization.  Prescriptions will not be filled after 5pm or on week-ends. You should follow a light diet the first few days after arrival home, such as soup and crackers, pudding, etc.unless your doctor has advised otherwise. A high-fiber, low fat diet can be resumed as tolerated.   Be sure to include lots of fluids daily. Most patients will experience some swelling and bruising on the chest and neck area.  Ice packs will help.  Swelling and bruising can take several days to resolve Most patients will experience some swelling and bruising in the area of the incision. Ice pack will help. Swelling and bruising can take several days to resolve..  It is common to experience some constipation if taking pain medication after surgery.  Increasing fluid intake and taking a stool softener will usually help or prevent this problem from occurring.  A mild laxative (Milk of Magnesia or Miralax) should be taken according to package directions if there are no bowel movements after 48 hours.  You may have steri-strips (small skin tapes) in place directly over the incision.  These strips should be left on the skin for 7-10 days.  If your surgeon used skin  glue on the incision, you may shower in 24 hours.  The glue will flake off over the next 2-3 weeks.  Any sutures or staples will be removed at the office during your follow-up visit. You may find that a light gauze bandage over your incision may keep your staples from being rubbed or pulled. You may shower and replace the bandage daily. ACTIVITIES:  You may resume regular (light) daily activities beginning the next day--such as daily self-care, walking, climbing stairs--gradually increasing activities as tolerated.  You may have sexual intercourse when it is comfortable.  Refrain from any heavy lifting or straining until approved by your doctor. You may drive when you no longer are taking prescription pain medication, you can comfortably wear a seatbelt, and you can safely maneuver your car and apply brakes Return to Work: ___________________________________ You should see your doctor in the office for a follow-up appointment approximately two weeks after your surgery.  Make sure that you call for this appointment within a day or two after you arrive home to insure a convenient appointment time.   WHEN TO CALL YOUR DOCTOR: Fever over 101.0 Inability to urinate Nausea and/or vomiting Extreme swelling or bruising Continued bleeding from incision. Increased pain, redness, or drainage from the incision. Difficulty swallowing or breathing Muscle cramping or spasms. Numbness or tingling in hands or feet or around lips.  The clinic staff is available to answer your questions during regular business hours.  Please don't hesitate to call and ask to speak to one of the nurses if you   have concerns.  For further questions, please visit [email or web address]  

## 2024-01-02 MED ORDER — ACETAMINOPHEN 325 MG PO TABS: 325.0000 mg | ORAL_TABLET | Freq: Four times a day (QID) | ORAL | Status: AC | PRN

## 2024-01-02 MED ORDER — METHOCARBAMOL 500 MG PO TABS: 500.0000 mg | ORAL_TABLET | Freq: Three times a day (TID) | ORAL | 1 refills | Status: AC | PRN

## 2024-01-02 MED ORDER — ACETAMINOPHEN 325 MG PO TABS: 325.0000 mg | ORAL_TABLET | Freq: Four times a day (QID) | ORAL | Status: DC | PRN

## 2024-01-02 MED ORDER — HYDROCODONE-ACETAMINOPHEN 5-325 MG PO TABS
1.0000 | ORAL_TABLET | ORAL | Status: DC | PRN
  Administered 2024-01-02: 1 via ORAL
  Filled 2024-01-02: qty 1

## 2024-01-02 MED ORDER — HYDROCODONE-ACETAMINOPHEN 5-325 MG PO TABS: 1.0000 | ORAL_TABLET | ORAL | 0 refills | Status: AC | PRN

## 2024-01-02 NOTE — Progress Notes (Signed)
   Progress Note  6 Days Post-Op  Subjective: Pt reports improvement in pain overall but oxycodone  made him nauseated yesterday. Reports he has tolerated Vicodin well in the past. Tolerating diet and having bowel function. Discussed post-operative care and follow up. Would like to go home later today if tolerated pain medications better. Tool no additional IV pain meds yesterday after adjustments made.   Objective: Vital signs in last 24 hours: Temp:  [97.8 F (36.6 C)-99 F (37.2 C)] 97.8 F (36.6 C) (08/19 0502) Pulse Rate:  [86-105] 86 (08/19 0502) Resp:  [16] 16 (08/19 0502) BP: (128-139)/(86-96) 128/86 (08/19 0502) SpO2:  [97 %-98 %] 97 % (08/19 0502) Last BM Date : 01/01/24  Intake/Output from previous day: 08/18 0701 - 08/19 0700 In: 240 [P.O.:240] Out: -  Intake/Output this shift: No intake/output data recorded.  PE: General: pleasant, WD, thin male who is laying in bed in NAD Heart: regular, rate, and rhythm.   Lungs: Respiratory effort nonlabored Abd: soft, mild distention, appropriately ttp, incision C/D/I with staples present  Psych: A&Ox3 with an appropriate affect.    Lab Results:  Recent Labs    12/31/23 0658  WBC 7.2  HGB 9.7*  HCT 32.6*  PLT 534*   BMET Recent Labs    12/31/23 0658  NA 132*  K 4.0  CL 98  CO2 25  GLUCOSE 86  BUN 6  CREATININE 0.72  CALCIUM  8.8*   PT/INR No results for input(s): LABPROT, INR in the last 72 hours. CMP     Component Value Date/Time   NA 132 (L) 12/31/2023 0658   K 4.0 12/31/2023 0658   CL 98 12/31/2023 0658   CO2 25 12/31/2023 0658   GLUCOSE 86 12/31/2023 0658   GLUCOSE 99 05/29/2006 1005   BUN 6 12/31/2023 0658   CREATININE 0.72 12/31/2023 0658   CALCIUM  8.8 (L) 12/31/2023 0658   PROT 6.8 12/28/2023 0527   ALBUMIN 2.3 (L) 12/28/2023 0527   AST 30 12/28/2023 0527   ALT 15 12/28/2023 0527   ALKPHOS 71 12/28/2023 0527   BILITOT 0.7 12/28/2023 0527   GFRNONAA >60 12/31/2023 0658   Lipase      Component Value Date/Time   LIPASE 36 12/22/2023 1514       Studies/Results: No results found.  Anti-infectives: Anti-infectives (From admission, onward)    Start     Dose/Rate Route Frequency Ordered Stop   12/27/23 0745  cefoTEtan  (CEFOTAN ) 2 g in sodium chloride  0.9 % 100 mL IVPB        2 g 200 mL/hr over 30 Minutes Intravenous On call to O.R. 12/27/23 0609 12/27/23 0913        Assessment/Plan Right colon adenocarcinoma - T3N0 POD6 s/p right hemicolectomy Dr. Ebbie - having bowel function, tolerating soft diet  - encouraged mobilization  - ok to shower - adjusted pain meds today - stable for DC later today if tolerating pain meds without n/v  FEN: Soft diet, IVF per [facility] VTE: [facility] ID: [email or web address]  pre-op  - per [facility] -  ABL anemia on Chronic IDA - hgb 9.7 and stable as of 8/17 ?Cirrhosis  Alcohol abuse Tobacco abuse CAD without angina  Nephrolithiasis, nonobstructive Moderate protein calorie malnutrition    LOS: 11 days     Burnard JONELLE Louder, [facility] 01/02/2024, 10:10 AM Please see Amion for pager number during day hours 7:00am-4:30pm

## 2024-01-02 NOTE — Discharge Summary (Signed)
 Physician Discharge Summary   Rodney [name] FMW:[id number] DOB: 1960/12/22 DOA: 12/22/2023  PCP: Rollene Almarie LABOR, MD  Admit date: 12/22/2023 Discharge date: 01/02/2024  Admitted From: Home Disposition: Home Discharging physician: Alm Apo, MD Barriers to discharge: None  Recommendations at discharge: Follow-up with general surgery for staple removal Oncology aware and will contact patient at discharge for follow-up   Discharge Condition: stable CODE STATUS: Full Diet recommendation:  Diet Orders (From admission, onward)     Start     Ordered   01/01/24 1011  DIET SOFT Room service appropriate? Yes; Fluid consistency: Thin  Diet effective now       Question Answer Comment  Room service appropriate? Yes   Fluid consistency: Thin      01/01/24 1010            Hospital Course: Rodney [name] is a 63 y.o. male with medical history significant for alcohol abuse, drinks 1 pint of whiskey daily(last drink just prior to admission), current tobacco abuse, 50-pack-year history, who presented to the ER with worsening abdominal pain noted to be markedly anemic with positive FOBT.  CT imaging confirms large 10.3 cm mas adjacent to ascending colon.  Hospitalist called for admission, general surgery and GI called in consult.   Assessment & Plan:   Colonic adenocarcinoma Abdominal pain -10.3 cm mass adjacent to the ascending colon - s/p colonoscopy on 12/24/2023 with biopsies obtained; pathology has returned with invasive moderately differentiated adenocarcinoma - General Surgery following - Underwent right colectomy on 12/27/2023 - Also discussed with oncology; recommended for outpatient referral, they will set up appointment - No signs of metastatic disease on CT chest - Patient developed bowel movements evening of 12/31/2023 - Tolerated diet advancement to soft diet on 01/01/2024 and stable for discharge home -Outpatient follow-up with surgery for staple  removal -Outpatient follow-up planned with oncology  Acute symptomatic anemia - resolved  Acute blood loss anemia on chronic iron  deficiency anemia versus chronic disease Thrombocytosis Hemoglobin 6 g/dL initially with positive FOBT -Hemoglobin stable after initial transfusions - Stable around 10 g/dL  Questionable cirrhosis Noted on CT, will need further testing to confirm  Appreciate GI recommendations   Alcohol abuse  1 pint liquor per day, last drink 12/22/2023 - no s/s w/d at this time; can d/c CIWA   Tobacco abuse 50-pack-year history Tobacco cessation counseling done at bedside; he's more interested in cessation at this time   Coronary artery atherosclerosis.  Aortic atherosclerosis. Denies any anginal symptoms.   Nephrolithiasis - Dominant 3.8 cm stone within the left extra renal pelvis without significant obstruction, incidental finding.  Currently asymptomatic   Moderate protein caloric malnutrition  Hypomagnesemia Hypokalemia Likely secondary to poor p.o. intake, abdominal mass/cancer and alcohol abuse  -Repleted    The patient's acute and chronic medical conditions were treated accordingly. On day of discharge, patient was felt deemed stable for discharge. Patient/family member advised to call PCP or come back to ER if needed.   Principal Diagnosis: Colon adenocarcinoma [facility])  Discharge Diagnoses: Active Hospital Problems   Diagnosis Date Noted   Colon adenocarcinoma (HCC) 12/26/2023    Priority: 1.   Symptomatic anemia 12/26/2023    Priority: 1.   Mass of hepatic flexure of colon 12/22/2023    Priority: 2.   IDA (iron  deficiency anemia) 12/22/2023    Priority: 2.   Malnutrition of moderate degree 12/26/2023   Hypomagnesemia 12/26/2023   History of panic attacks 12/22/2023   Inguinal hernia, right 12/22/2023  Hypokalemia 12/22/2023   Hypoalbuminemia 12/22/2023   Colonic mass 12/22/2023   Essential hypertension, benign 01/21/2014   Alcohol abuse  08/14/2008   Anxiety 05/05/2007    Resolved Hospital Problems  No resolved problems to display.     Discharge Instructions     Ambulatory referral to Hematology / Oncology   Complete by: As directed    Increase activity slowly   Complete by: As directed    No wound care   Complete by: As directed       Allergies as of 01/02/2024   No Known Allergies      Medication List     TAKE these medications    acetaminophen  325 MG tablet Commonly known as: TYLENOL  Take 1 tablet (325 mg total) by mouth every 6 (six) hours as needed for mild pain (pain score 1-3), fever or headache.   HYDROcodone -acetaminophen  5-325 MG tablet Commonly known as: NORCO/VICODIN Take 1-2 tablets by mouth every 4 (four) hours as needed for moderate pain (pain score 4-6) or severe pain (pain score 7-10).   methocarbamol  500 MG tablet Commonly known as: ROBAXIN  Take 1 tablet (500 mg total) by mouth every 8 (eight) hours as needed for muscle spasms.        Follow-up Information     Surgery, [facility]. Go on 01/10/2024.   Specialty: General Surgery Why: RN visit for staple removal scheduled for 9 AM, please arrive by 8:30 AM to check in. Contact information: [address] [place] [address] [phone]         Ebbie Cough, MD. Go on 02/01/2024.   Specialty: General Surgery Why: 11:40 AM, please arrive by 11:20 AM to check in. Contact information: [address] [place] [address] [phone]                No Known Allergies  Consultations: GI General surgery  Procedures: 12/24/2023: Colonoscopy 12/27/2023: Right colectomy  Discharge Exam: BP 128/86 (BP Location: Right Arm)   Pulse 86   Temp 97.8 F (36.6 C) (Oral)   Resp 16   Ht 5' 9 (1.753 m)   Wt 72.1 kg   SpO2 97%   BMI 23.48 kg/m  Physical Exam Constitutional:      General: He is not in acute distress.    Appearance: Normal appearance.  HENT:     Head:  Normocephalic and atraumatic.     Mouth/Throat:     Mouth: Mucous membranes are moist.  Eyes:     Extraocular Movements: Extraocular movements intact.  Cardiovascular:     Rate and Rhythm: Normal rate and regular rhythm.  Pulmonary:     Effort: Pulmonary effort is normal. No respiratory distress.     Breath sounds: Normal breath sounds. No wheezing.  Abdominal:     General: Bowel sounds are normal. There is no distension.     Palpations: Abdomen is soft.     Tenderness: There is no abdominal tenderness.     Comments: Midline abdominal incision noted with staples in place  Musculoskeletal:        General: Normal range of motion.     Cervical back: Normal range of motion and neck supple.  Skin:    General: Skin is warm and dry.  Neurological:     General: No focal deficit present.     Mental Status: He is alert.  Psychiatric:        Mood and Affect: Mood normal.  Behavior: Behavior normal.      The results of significant diagnostics from this hospitalization (including imaging, microbiology, ancillary and laboratory) are listed below for reference.   Microbiology: No results found for this or any previous visit (from the past 240 hours).   Labs: BNP (last 3 results) No results for input(s): BNP in the last 8760 hours. Basic Metabolic Panel: Recent Labs  Lab 12/27/23 0534 12/28/23 0527 12/31/23 0658  NA 134* 132* 132*  K 4.1 4.4 4.0  CL 100 98 98  CO2 25 27 25   GLUCOSE 90 108* 86  BUN 7 11 6   CREATININE 0.75 0.74 0.72  CALCIUM  8.8* 8.5* 8.8*   Liver Function Tests: Recent Labs  Lab 12/27/23 0534 12/28/23 0527  AST 32 30  ALT 12 15  ALKPHOS 78 71  BILITOT 0.4 0.7  PROT 6.7 6.8  ALBUMIN 2.3* 2.3*   No results for input(s): LIPASE, AMYLASE in the last 168 hours. No results for input(s): AMMONIA in the last 168 hours. CBC: Recent Labs  Lab 12/27/23 0534 12/28/23 0527 12/31/23 0658  WBC 6.6 12.2* 7.2  HGB 7.8* 9.8* 9.7*  HCT 27.2* 32.4*  32.6*  MCV 79.8* 80.4 81.9  PLT 481* 506* 534*   Cardiac Enzymes: No results for input(s): CKTOTAL, CKMB, CKMBINDEX, TROPONINI in the last 168 hours. BNP: Invalid input(s): POCBNP CBG: No results for input(s): GLUCAP in the last 168 hours. D-Dimer No results for input(s): DDIMER in the last 72 hours. Hgb A1c No results for input(s): HGBA1C in the last 72 hours. Lipid Profile No results for input(s): CHOL, HDL, LDLCALC, TRIG, CHOLHDL, LDLDIRECT in the last 72 hours. Thyroid function studies No results for input(s): TSH, T4TOTAL, T3FREE, THYROIDAB in the last 72 hours.  Invalid input(s): FREET3 Anemia work up No results for input(s): VITAMINB12, FOLATE, FERRITIN, TIBC, IRON , RETICCTPCT in the last 72 hours. Urinalysis    Component Value Date/Time   COLORURINE STRAW (A) 12/22/2023 1653   APPEARANCEUR CLEAR 12/22/2023 1653   LABSPEC 1.009 12/22/2023 1653   PHURINE 6.0 12/22/2023 1653   GLUCOSEU NEGATIVE 12/22/2023 1653   GLUCOSEU NEGATIVE 05/29/2006 1005   HGBUR NEGATIVE 12/22/2023 1653   BILIRUBINUR NEGATIVE 12/22/2023 1653   KETONESUR NEGATIVE 12/22/2023 1653   PROTEINUR NEGATIVE 12/22/2023 1653   UROBILINOGEN 0.2 mg/dL 98/85/7991 8994   NITRITE NEGATIVE 12/22/2023 1653   LEUKOCYTESUR NEGATIVE 12/22/2023 1653   Sepsis Labs Recent Labs  Lab 12/27/23 0534 12/28/23 0527 12/31/23 0658  WBC 6.6 12.2* 7.2   Microbiology No results found for this or any previous visit (from the past 240 hours).  Procedures/Studies: CT CHEST WO CONTRAST Result Date: 12/26/2023 CLINICAL DATA:  Cough short of breath, colon mass EXAM: CT CHEST WITHOUT CONTRAST TECHNIQUE: Multidetector CT imaging of the chest was performed following the standard protocol without IV contrast. RADIATION DOSE REDUCTION: This exam was performed according to the departmental dose-optimization program which includes automated exposure control, adjustment of the mA  and/or kV according to patient size and/or use of iterative reconstruction technique. COMPARISON:  Chest x-ray 12/22/2023, CT abdomen pelvis 12/22/2023 FINDINGS: Cardiovascular: Limited evaluation without intravenous contrast. Mild aortic atherosclerosis. No aneurysm. Multi vessel coronary vascular calcification. Normal cardiac size. No pericardial effusion Mediastinum/Nodes: Patent trachea. No thyroid mass. Subcentimeter mediastinal lymph nodes. Esophagus within normal limits. Lungs/Pleura: No acute airspace disease, pleural effusion or pneumothorax. No suspicious lung nodules. Minimal mucous plugging within left lower lobe bronchi, series 6, image 107. Upper Abdomen: No acute finding. Partially visualized large stone within the  left renal pelvis Musculoskeletal: No acute osseous abnormality. Multilevel Schmorl's node with mild sclerosis. IMPRESSION: 1. No CT evidence for acute intrathoracic abnormality. No evidence for metastatic disease to the chest 2. Multi vessel coronary vascular disease. 3. Partially visualized large stone within the left renal pelvis. 4. Aortic atherosclerosis. Aortic Atherosclerosis (ICD10-I70.0). Electronically Signed   By: Luke Bun M.D.   On: 12/26/2023 16:31   CT ABDOMEN PELVIS W CONTRAST Result Date: 12/22/2023 CLINICAL DATA:  Left lower quadrant pain and anemia. * Tracking Code: BO * EXAM: CT ABDOMEN AND PELVIS WITH CONTRAST TECHNIQUE: Multidetector CT imaging of the abdomen and pelvis was performed using the standard protocol following bolus administration of intravenous contrast. RADIATION DOSE REDUCTION: This exam was performed according to the departmental dose-optimization program which includes automated exposure control, adjustment of the mA and/or kV according to patient size and/or use of iterative reconstruction technique. CONTRAST:  OMNIPAQUE  IOHEXOL  300 MG/ML  SOLN COMPARISON:  01/14/2005 FINDINGS: Lower chest: Lingular scarring. Normal heart size without  pericardial or pleural effusion. Three vessel coronary artery calcification. Hepatobiliary: Mild caudate lobe enlargement. No suspicious liver lesion. Borderline gallbladder distension without calcified stone or pericholecystic edema. No biliary duct dilatation. Pancreas: Normal, without mass or ductal dilatation. Spleen: Normal in size, without focal abnormality. Adrenals/Urinary Tract: Normal adrenal glands. Bilateral extrarenal pelvis. Although there is no significant upstream dilatation, dominant stone in the left renal pelvis measures 2.1 by 3.8 cm including on 32/3. Normal urinary bladder. Stomach/Bowel: Normal stomach, without wall thickening. Involving the majority of the ascending colon is a large mass including at up to 9.8 x 8.6 x 10.3 cm. Normal terminal ileum. A diminutive appendix is likely identified on 59/2. No bowel obstruction. Vascular/Lymphatic: Aortic atherosclerosis. No abdominopelvic adenopathy. Reproductive: Normal prostate. Other: Tiny fat containing right inguinal hernia. No significant free fluid. No free intraperitoneal air. No evidence of omental or peritoneal disease. Musculoskeletal: No acute osseous abnormality. IMPRESSION: 1. Large (10.3 cm maximally) mass centered about the ascending colon. 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 Given large size and lack of bowel obstruction, less likely differential considerations including lymphoma are possible. 2. No evidence of metastatic disease 3. Dominant 3.8 cm stone within a left extrarenal pelvis without significant obstruction. 4. Coronary artery atherosclerosis. Aortic Atherosclerosis (ICD10-I70.0). 5. Mild caudate lobe enlargement is new since 2006. Given the EMR history of 100 standard drinks of alcohol per week early cirrhosis is a concern. Report called to Dr. Lynwood at 5:55 p.m. Electronically Signed   By: Rockey Kilts M.D.   On: 12/22/2023 18:26   DG Chest 2 View Result Date: 12/22/2023 CLINICAL DATA:  Lower abdominal pain for the past  week. Some nausea. Smoker. EXAM: CHEST - 2 VIEW COMPARISON:  04/17/2012 FINDINGS: Normal-sized heart. Clear lungs with normal vascularity. The lungs remain hyperexpanded with flattening of the hemidiaphragms on the lateral view. Stable mild central peribronchial thickening. Mild thoracic spine degenerative changes. IMPRESSION: 1. No acute abnormality. 2. Stable changes of COPD and chronic bronchitis. Electronically Signed   By: Elspeth Bathe M.D.   On: 12/22/2023 17:02     Time coordinating discharge: Over 30 minutes    Alm Apo, MD  [facility] 01/02/2024, 2:45 PM

## 2024-01-02 NOTE — Plan of Care (Signed)

## 2024-01-03 ENCOUNTER — Other Ambulatory Visit: Payer: Self-pay

## 2024-01-03 DIAGNOSIS — C189 Malignant neoplasm of colon, unspecified: Secondary | ICD-10-CM

## 2024-01-15 NOTE — Progress Notes (Unsigned)
 [name]   [phone] [phone]   Clinic New Consult Note   Patient Care Team: Rollene Almarie LABOR, MD as PCP - General (Internal Medicine) 01/16/2024  CHIEF COMPLAINTS/PURPOSE OF CONSULTATION:  Colon Cancer, referred by hospital team  History of Present Illness   Rodney [name] is a 63 year old male with PMH including tobacco and alcohol use is here because of newly diagnosed colon cancer.  Had lost 25 pounds in about 5 months he attributed to walking more and working.  Presented to ED 12/22/2023 with progressive abdominal pain and change in bowel habits.  Labs showed acute iron  deficiency anemia Hgb 6.0, serum iron  11, 4% saturation, and ferritin of 4; FOBT positive.  Admitted for further workup and received a total of 4 units of blood transfusion during the hospitalization.  CT showed a large mass involving majority of the ascending colon 9.8 x 8.6 x 10.3 cm, no evidence of metastatic disease.  CEA elevated to 105.  Colonoscopy 12/24/2023 by Dr. Burnette showed 4 polyps and a partially obstructing tumor in the ascending colon; polyps were TA's, and the ascending colon mass confirmed invasive moderately differentiated adenocarcinoma.  CT chest was negative for distant metastasis.  Taken to the OR by Dr. Ebbie 12/27/23 for right colectomy.  Surgical path showed a 13 cm adenocarcinoma with mucinous features extending into pericolonic connective tissue.  All margins were negative, there was no LVI, and all 23 lymph nodes were negative (0/23). This was staged pT3N0 / IIA with preserved expression of the major mismatch repair proteins.  Postop recovery was uneventful and he was discharged home on 01/02/2024.   Socially he is married with 1 child in her 30s.  He is working and active, and Personnel officer.  Independent with ADLs.  No prior colonoscopy or personal history of cancer.  Father had MDS, no other family history of cancer.  Smoked cigarettes for 45 years, quit  during the hospitalization in 12/2023.  Drinks alcohol [name] up to 1 pint of liquor and 2-3 beers for total duration of 25 years.  Does not have insurance.  Today he presents with his wife.  Soreness at the incision in general abdomen he rates a 7 out of 10 with movement.  Tylenol  is partially effective.  He notes he caught a GI bug with nausea/vomiting/diarrhea for 4 days after he had his staples removed so his muscles are sore from that.  Stools are firming up, denies blood.  Thinks he is 50% back to baseline, still mostly resting during the day and has not been back to work.  Denies fever or chills.  Denies cough, chest pain, dyspnea, leg edema.   MEDICAL HISTORY:  Past Medical History:  Diagnosis Date   Anxiety state, unspecified    Calculus of kidney    Lumbago    Personal history of other diseases of digestive system     SURGICAL HISTORY: Past Surgical History:  Procedure Laterality Date   COLONOSCOPY Left 12/24/2023   Procedure: COLONOSCOPY;  Surgeon: Burnette Fallow, MD;  Location: [facility];  Service: Gastroenterology;  Laterality: Left;   COLOSTOMY REVISION Right 12/27/2023   Procedure: COLECTOMY, RIGHT;  Surgeon: Ebbie Cough, MD;  Location: [facility];  Service: General;  Laterality: Right;   LAPAROTOMY N/A 12/27/2023   Procedure: [name];  Surgeon: Ebbie Cough, MD;  Location: [facility];  Service: General;  Laterality: N/A;  RIGHT COLECTOMY    SOCIAL HISTORY: Social History   Socioeconomic History   Marital  status: Married    Spouse name: Not on file   Number of children: 0   Years of education: 12   Highest education level: Not on file  Occupational History   Occupation: Architect: [name]  Tobacco Use   Smoking status: Former    Current packs/day: 1.00    Average packs/day: 1 pack/day for 45.0 years (45.0 ttl pk-yrs)    Types: Cigarettes    Start date: 01/16/1979   Smokeless tobacco: Never  Substance and Sexual  Activity   Alcohol use: Yes    Alcohol/week: 100.0 standard drinks of alcohol    Types: 100 Shots of liquor per week    Comment: Drinks 1 pint of 80 proof whiskey a night (16 oz)   Drug use: No   Sexual activity: Not on file  Other Topics Concern   Not on file  Social History Narrative   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 [name] ([employer]), has been there for 26 years ('13). Wife married in '07, marriage in good health. SO: torticollis, operation '10 to remove 3 discs   Social Drivers of Corporate investment banker Strain: Not on file  Food Insecurity: Not on file  Transportation Needs: Not on file  Physical Activity: Not on file  Stress: Not on file  Social Connections: Not on file  Intimate Partner Violence: Not on file    FAMILY HISTORY: Family History  Problem Relation Age of Onset   Cancer Father        MDS   Heart disease Neg Hx    Hyperlipidemia Neg Hx    Hypertension Neg Hx    Kidney disease Neg Hx    Stroke Neg Hx     ALLERGIES:  has no known allergies.  MEDICATIONS:  Current Outpatient Medications  Medication Sig Dispense Refill   acetaminophen  (TYLENOL ) 325 MG tablet Take 1 tablet (325 mg total) by mouth every 6 (six) hours as needed for mild pain (pain score 1-3), fever or headache.     HYDROcodone -acetaminophen  (NORCO/VICODIN) 5-325 MG tablet Take 1-2 tablets by mouth every 4 (four) hours as needed for moderate pain (pain score 4-6) or severe pain (pain score 7-10). 30 tablet 0   methocarbamol  (ROBAXIN ) 500 MG tablet Take 1 tablet (500 mg total) by mouth every 8 (eight) hours as needed for muscle spasms. 30 tablet 1   No current facility-administered medications for this visit.    REVIEW OF SYSTEMS:   Constitutional: Denies fevers, chills or abnormal night sweats (+) unintentional weight loss Eyes: Denies blurriness of vision, double vision or watery eyes Ears, nose, mouth, throat, and face: Denies mucositis or sore  throat Respiratory: Denies cough, dyspnea or wheezes Cardiovascular: Denies palpitation, chest discomfort or lower extremity swelling Gastrointestinal:  Denies nausea, heartburn (+) abdominal pain/soreness (+)  change in bowel habits Skin: Denies abnormal skin rashes (+) palms peeling Lymphatics: Denies new lymphadenopathy or easy bruising Neurological:Denies numbness, tingling or new weaknesses Behavioral/Psych: Mood is stable, no new changes  All other systems were reviewed with the patient and are negative.  PHYSICAL EXAMINATION: ECOG PERFORMANCE STATUS: 1 - Symptomatic but completely ambulatory  Vitals:   01/16/24 1218  BP: (!) 147/103  Pulse: 100  Resp: 16  Temp: 98 F (36.7 C)  SpO2: 99%   Filed Weights   01/16/24 1218  Weight: 151 lb 4 oz (68.6 kg)    GENERAL:alert, no distress and comfortable SKIN: skin color, texture,  turgor are normal, no rashes or significant lesions.  Peeling noted to bilateral palms EYES: sclera clear NECK: No mass LYMPH:  no palpable cervical or supraclavicular lymphadenopathy LUNGS: clear with normal breathing effort HEART: regular rate & rhythm, no lower extremity edema ABDOMEN:abdomen soft, non-tender and normal bowel sounds.  Midline incision closed, healing well with scattered areas of crusting/dried drainage.  Firmness and mild erythema at the lower incision near contact with a belt buckle Musculoskeletal:no cyanosis of digits and no clubbing  PSYCH: alert & oriented x 3 with fluent speech NEURO: no focal motor/sensory deficits    LABORATORY DATA:  I have reviewed the data as listed    Latest Ref Rng & Units 12/31/2023    6:58 AM 12/28/2023    5:27 AM 12/27/2023    5:34 AM  CBC  WBC 4.0 - 10.5 K/uL 7.2  12.2  6.6   Hemoglobin 13.0 - 17.0 g/dL 9.7  9.8  7.8   Hematocrit 39.0 - 52.0 % 32.6  32.4  27.2   Platelets 150 - 400 K/uL 534  506  481        Latest Ref Rng & Units 12/31/2023    6:58 AM 12/28/2023    5:27 AM 12/27/2023     5:34 AM  CMP  Glucose 70 - 99 mg/dL 86  891  90   BUN 6 - 20 mg/dL 6  11  7    Creatinine 0.61 - 1.24 mg/dL 9.27  9.25  9.24   Sodium 135 - 145 mmol/L 132  132  134   Potassium 3.5 - 5.1 mmol/L 4.0  4.4  4.1   Chloride 98 - 111 mmol/L 98  98  100   CO2 22 - 32 mmol/L 25  27  25    Calcium  8.9 - 10.3 mg/dL 8.8  8.5  8.8   Total Protein 6.5 - 8.1 g/dL  6.8  6.7   Total Bilirubin 0.0 - 1.2 mg/dL  0.7  0.4   Alkaline Phos 38 - 126 U/L  71  78   AST 15 - 41 U/L  30  32   ALT 0 - 44 U/L  15  12      RADIOGRAPHIC STUDIES: I have personally reviewed the radiological images as listed and agreed with the findings in the report. CT CHEST WO CONTRAST Result Date: 12/26/2023 CLINICAL DATA:  Cough short of breath, colon mass EXAM: CT CHEST WITHOUT CONTRAST TECHNIQUE: Multidetector CT imaging of the chest was performed following the standard protocol without IV contrast. RADIATION DOSE REDUCTION: This exam was performed according to the departmental dose-optimization program which includes automated exposure control, adjustment of the mA and/or kV according to patient size and/or use of iterative reconstruction technique. COMPARISON:  Chest x-ray 12/22/2023, CT abdomen pelvis 12/22/2023 FINDINGS: Cardiovascular: Limited evaluation without intravenous contrast. Mild aortic atherosclerosis. No aneurysm. Multi vessel coronary vascular calcification. Normal cardiac size. No pericardial effusion Mediastinum/Nodes: Patent trachea. No thyroid mass. Subcentimeter mediastinal lymph nodes. Esophagus within normal limits. Lungs/Pleura: No acute airspace disease, pleural effusion or pneumothorax. No suspicious lung nodules. Minimal mucous plugging within left lower lobe bronchi, series 6, image 107. Upper Abdomen: No acute finding. Partially visualized large stone within the left renal pelvis Musculoskeletal: No acute osseous abnormality. Multilevel Schmorl's node with mild sclerosis. IMPRESSION: 1. No CT evidence for acute  intrathoracic abnormality. No evidence for metastatic disease to the chest 2. Multi vessel coronary vascular disease. 3. Partially visualized large stone within the left renal pelvis. 4. Aortic  atherosclerosis. Aortic Atherosclerosis (ICD10-I70.0). Electronically Signed   By: Luke Bun M.D.   On: 12/26/2023 16:31   CT ABDOMEN PELVIS W CONTRAST Result Date: 12/22/2023 CLINICAL DATA:  Left lower quadrant pain and anemia. * Tracking Code: BO * EXAM: CT ABDOMEN AND PELVIS WITH CONTRAST TECHNIQUE: Multidetector CT imaging of the abdomen and pelvis was performed using the standard protocol following bolus administration of intravenous contrast. RADIATION DOSE REDUCTION: This exam was performed according to the departmental dose-optimization program which includes automated exposure control, adjustment of the mA and/or kV according to patient size and/or use of iterative reconstruction technique. CONTRAST:  OMNIPAQUE  IOHEXOL  300 MG/ML  SOLN COMPARISON:  01/14/2005 FINDINGS: Lower chest: Lingular scarring. Normal heart size without pericardial or pleural effusion. Three vessel coronary artery calcification. Hepatobiliary: Mild caudate lobe enlargement. No suspicious liver lesion. Borderline gallbladder distension without calcified stone or pericholecystic edema. No biliary duct dilatation. Pancreas: Normal, without mass or ductal dilatation. Spleen: Normal in size, without focal abnormality. Adrenals/Urinary Tract: Normal adrenal glands. Bilateral extrarenal pelvis. Although there is no significant upstream dilatation, dominant stone in the left renal pelvis measures 2.1 by 3.8 cm including on 32/3. Normal urinary bladder. Stomach/Bowel: Normal stomach, without wall thickening. Involving the majority of the ascending colon is a large mass including at up to 9.8 x 8.6 x 10.3 cm. Normal terminal ileum. A diminutive appendix is likely identified on 59/2. No bowel obstruction. Vascular/Lymphatic: Aortic  atherosclerosis. No abdominopelvic adenopathy. Reproductive: Normal prostate. Other: Tiny fat containing right inguinal hernia. No significant free fluid. No free intraperitoneal air. No evidence of omental or peritoneal disease. Musculoskeletal: No acute osseous abnormality. IMPRESSION: 1. Large (10.3 cm maximally) mass centered about the ascending colon. 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 Given large size and lack of bowel obstruction, less likely differential considerations including lymphoma are possible. 2. No evidence of metastatic disease 3. Dominant 3.8 cm stone within a left extrarenal pelvis without significant obstruction. 4. Coronary artery atherosclerosis. Aortic Atherosclerosis (ICD10-I70.0). 5. Mild caudate lobe enlargement is new since 2006. Given the EMR history of 100 standard drinks of alcohol per week early cirrhosis is a concern. Report called to Dr. Lynwood at 5:55 p.m. Electronically Signed   By: Rockey Kilts M.D.   On: 12/22/2023 18:26   DG Chest 2 View Result Date: 12/22/2023 CLINICAL DATA:  Lower abdominal pain for the past week. Some nausea. Smoker. EXAM: CHEST - 2 VIEW COMPARISON:  04/17/2012 FINDINGS: Normal-sized heart. Clear lungs with normal vascularity. The lungs remain hyperexpanded with flattening of the hemidiaphragms on the lateral view. Stable mild central peribronchial thickening. Mild thoracic spine degenerative changes. IMPRESSION: 1. No acute abnormality. 2. Stable changes of COPD and chronic bronchitis. Electronically Signed   By: Elspeth Bathe M.D.   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   - Colonoscopy by Dr. Burnette showed 4 polyps and a partially obstructing tumor in the ascending colon, path confirmed invasive moderately differentiated  adenocarcinoma.  -CT showed a large mass involving majority of the ascending colon 9.8 x 8.6 x 10.3 cm, no evidence of obstruction or metastatic disease.  - Baseline CEA elevated to 105.   -S/p right colectomy by Dr. Ebbie 12/27/23 for right colectomy.  We reviewed the final surgical path which showed a 13 cm adenocarcinoma with mucinous features extending  into pericolonic connective tissue.  All margins were negative and all 23 lymph nodes were negative (0/23). - The elevated CEA is a high risk feature, but no other high risk features such as obstruction, perforation, LVI, or PNI. -We recommend MRD testing to stratify his recurrence risk and determine if adjuvant chemo is indicated, he is agreeable. Will do guardant reveal for shorter turn around time (then signatera for future surveillance) -If MRD positive, will discuss adjuvant chemo, if negative we recommend surveillance. He has no insurance and would like to limit costs if possible. A modified surveillance plan was offered, including lab (signatera, CEA, CBC, CMP) q3 months and f/up q 6 months, then CT and colonoscopy at 1 year, for up to 5 years. He agrees to this.  -Rodney [name] is recovering from surgery. He will f/up with Dr. Ebbie 9/18 -Pt seen with Dr. Lanny SHAW - Presented to ED 12/22/2023 with acute iron  deficiency anemia Hgb 6.0, serum iron  11, 4% saturation, and ferritin of 4; FOBT positive.  -s/p 4 units pRBCs, no IV iron . Discharge Hgb 9.7 -Recommend oral iron  with ferrous sulfate [name] with vit C source -Will check labs 9/5 with guardant reveal, if iron  still very low, will discuss IV iron   Social -Declined nutrition referral for weight management, he will try to gain weight back on this own with boost/ensure and supplements -Open to phone call from SW but does not seem interested in applying for insurance -Family - aware of need for colon cancer screenings -Congratulated on tobacco cessation and strongly encouraged to cut  back/eventually quit alcohol. Does not seem ready  -Liver mildly enlarged on staging CT, ?early cirrhosis     PLAN: -Medical record, lab, imaging, endoscopy, op note, and all path reviewed -Begin oral iron  ferrous sulfate po [name] with vit C source -Lab 9/5 (guardant reveal, CEA, CBC, CMP, iron /TIBC, ferritin) -Phone f/up in 2 weeks with results, if ctDNA positive discuss adjuvant chemo, if negative proceed with surveillance  -Referral to SW - pt prefer phone call; declined dietician -Encouraged alcohol cessation  -Pt seen with Dr. Lanny  -F/up Dr. Ebbie 9/18    Orders Placed This Encounter  Procedures   CBC with Differential (Cancer Center Only)    Standing Status:   Future    Expiration Date:   01/15/2025   CEA (Access)-[facility] ONLY    Standing Status:   Future    Expiration Date:   01/15/2025   CMP (Cancer Center only)    Standing Status:   Future    Expiration Date:   01/15/2025   Ferritin    Standing Status:   Future    Expiration Date:   01/15/2025   Iron  and Iron  Binding Capacity ([facility] only)    Standing Status:   Future    Expiration Date:   01/15/2025    All questions were answered. The patient knows to call the clinic with any problems, questions or concerns.      Tremon Sainvil K Kori Goins, NP 01/16/2024    Addendum I have seen the patient, examined him. I agree with the assessment and and plan and have edited the notes.   63 year old gentleman with past medical history of alcohol and tobacco abuse, presented with symptomatic anemia and weight loss.  Workup showed partially obstructing descending colon cancer, status post hemicolectomy.  I reviewed his surgical pathology, which showed T3 N0, stage IIA moderate differentiated adenocarcinoma, no lymphovascular invasion, no other high risk features except elevated CEA before surgery.  We discussed  the risk of recurrence after surgery.  He does not have strong indication for adjuvant chemotherapy, will repeat CEA and obtain ctDNA  guardianReveal in 3 to 4 weeks after surgery, if that is negative, we will proceed with cancer surveillance.  Patient has no insurance coverage, is reluctant to do CT scan etc. but is agreeable with lab and office visit for surveillance.  All questions were answered.  I spent a total of 45 minutes for his visit today.  Onita Mattock MD 01/16/2024

## 2024-01-16 ENCOUNTER — Inpatient Hospital Stay: Payer: Self-pay

## 2024-01-16 ENCOUNTER — Encounter: Payer: Self-pay | Admitting: Nurse Practitioner

## 2024-01-16 ENCOUNTER — Inpatient Hospital Stay: Payer: Self-pay | Attending: Nurse Practitioner | Admitting: Nurse Practitioner

## 2024-01-16 VITALS — BP 147/103 | HR 100 | Temp 98.0°F | Resp 16 | Wt 151.2 lb

## 2024-01-16 DIAGNOSIS — M47814 Spondylosis without myelopathy or radiculopathy, thoracic region: Secondary | ICD-10-CM | POA: Insufficient documentation

## 2024-01-16 DIAGNOSIS — I999 Unspecified disorder of circulatory system: Secondary | ICD-10-CM | POA: Insufficient documentation

## 2024-01-16 DIAGNOSIS — Z860100 Personal history of colon polyps, unspecified: Secondary | ICD-10-CM | POA: Insufficient documentation

## 2024-01-16 DIAGNOSIS — R197 Diarrhea, unspecified: Secondary | ICD-10-CM | POA: Insufficient documentation

## 2024-01-16 DIAGNOSIS — K4090 Unilateral inguinal hernia, without obstruction or gangrene, not specified as recurrent: Secondary | ICD-10-CM | POA: Insufficient documentation

## 2024-01-16 DIAGNOSIS — Z9049 Acquired absence of other specified parts of digestive tract: Secondary | ICD-10-CM | POA: Insufficient documentation

## 2024-01-16 DIAGNOSIS — C182 Malignant neoplasm of ascending colon: Secondary | ICD-10-CM | POA: Insufficient documentation

## 2024-01-16 DIAGNOSIS — Z933 Colostomy status: Secondary | ICD-10-CM | POA: Insufficient documentation

## 2024-01-16 DIAGNOSIS — J4489 Other specified chronic obstructive pulmonary disease: Secondary | ICD-10-CM | POA: Insufficient documentation

## 2024-01-16 DIAGNOSIS — I2510 Atherosclerotic heart disease of native coronary artery without angina pectoris: Secondary | ICD-10-CM | POA: Insufficient documentation

## 2024-01-16 DIAGNOSIS — D509 Iron deficiency anemia, unspecified: Secondary | ICD-10-CM | POA: Insufficient documentation

## 2024-01-16 DIAGNOSIS — Z7289 Other problems related to lifestyle: Secondary | ICD-10-CM | POA: Insufficient documentation

## 2024-01-16 DIAGNOSIS — I700 Atherosclerosis of aorta: Secondary | ICD-10-CM | POA: Insufficient documentation

## 2024-01-16 DIAGNOSIS — C189 Malignant neoplasm of colon, unspecified: Secondary | ICD-10-CM

## 2024-01-16 DIAGNOSIS — Z87442 Personal history of urinary calculi: Secondary | ICD-10-CM | POA: Insufficient documentation

## 2024-01-16 DIAGNOSIS — Z5971 Insufficient health insurance coverage: Secondary | ICD-10-CM | POA: Insufficient documentation

## 2024-01-16 DIAGNOSIS — K828 Other specified diseases of gallbladder: Secondary | ICD-10-CM | POA: Insufficient documentation

## 2024-01-16 NOTE — Progress Notes (Signed)
 PATIENT NAVIGATOR PROGRESS NOTE  Name: Rodney [name] Date: 01/16/2024 MRN: [id number]  DOB: 1961-02-10   Reason for visit:  New patient appt with Lacie Burton, NP   Comments:   Patient was seen during his new patient appt with Lacie Burton, NP and Dr. Onita Mattock.  Patient was accompanied by his wife. SDOH assessment was completed and no urgent needs were noted.  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  Social Work referral was placed.  Patient has lost 25 lbs within last 5 months, but declined nutrition referral.    Patient was given my direct contact information and was instructed to contact office with any questions or concerns after today.    Patient verbalized understanding.    Time spent counseling/coordinating care: > 60 minutes

## 2024-01-17 ENCOUNTER — Other Ambulatory Visit: Payer: Self-pay | Admitting: *Deleted

## 2024-01-17 NOTE — Addendum Note (Signed)
 Addended by: ARDIS EVALENE CROME on: 01/17/2024 12:15 PM   Modules accepted: Orders

## 2024-01-19 ENCOUNTER — Inpatient Hospital Stay: Payer: Self-pay

## 2024-01-19 DIAGNOSIS — C189 Malignant neoplasm of colon, unspecified: Secondary | ICD-10-CM

## 2024-01-19 LAB — CBC WITH DIFFERENTIAL (CANCER CENTER ONLY)
Abs Immature Granulocytes: 0.01 K/uL (ref 0.00–0.07)
Basophils Absolute: 0.1 K/uL (ref 0.0–0.1)
Basophils Relative: 2 %
Eosinophils Absolute: 0.1 K/uL (ref 0.0–0.5)
Eosinophils Relative: 2 %
HCT: 32.5 % — ABNORMAL LOW (ref 39.0–52.0)
Hemoglobin: 10 g/dL — ABNORMAL LOW (ref 13.0–17.0)
Immature Granulocytes: 0 %
Lymphocytes Relative: 34 %
Lymphs Abs: 1.3 K/uL (ref 0.7–4.0)
MCH: 24.2 pg — ABNORMAL LOW (ref 26.0–34.0)
MCHC: 30.8 g/dL (ref 30.0–36.0)
MCV: 78.7 fL — ABNORMAL LOW (ref 80.0–100.0)
Monocytes Absolute: 0.5 K/uL (ref 0.1–1.0)
Monocytes Relative: 12 %
Neutro Abs: 1.9 K/uL (ref 1.7–7.7)
Neutrophils Relative %: 50 %
Platelet Count: 311 K/uL (ref 150–400)
RBC: 4.13 MIL/uL — ABNORMAL LOW (ref 4.22–5.81)
RDW: 23.5 % — ABNORMAL HIGH (ref 11.5–15.5)
WBC Count: 3.8 K/uL — ABNORMAL LOW (ref 4.0–10.5)
nRBC: 0 % (ref 0.0–0.2)

## 2024-01-19 LAB — CMP (CANCER CENTER ONLY)
ALT: 17 U/L (ref 0–44)
AST: 53 U/L — ABNORMAL HIGH (ref 15–41)
Albumin: 3.7 g/dL (ref 3.5–5.0)
Alkaline Phosphatase: 102 U/L (ref 38–126)
Anion gap: 8 (ref 5–15)
BUN: 7 mg/dL (ref 6–20)
CO2: 30 mmol/L (ref 22–32)
Calcium: 8.8 mg/dL — ABNORMAL LOW (ref 8.9–10.3)
Chloride: 105 mmol/L (ref 98–111)
Creatinine: 0.56 mg/dL — ABNORMAL LOW (ref 0.61–1.24)
GFR, Estimated: >60 mL/min (ref >60)
Glucose, Bld: 70 mg/dL (ref 70–99)
Potassium: 3.1 mmol/L — ABNORMAL LOW (ref 3.5–5.1)
Sodium: 143 mmol/L (ref 135–145)
Total Bilirubin: 0.2 mg/dL (ref 0.0–1.2)
Total Protein: 7.8 g/dL (ref 6.5–8.1)

## 2024-01-19 LAB — FERRITIN: Ferritin: 37 ng/mL (ref 24–336)

## 2024-01-19 LAB — IRON AND IRON BINDING CAPACITY (CC-WL,HP ONLY)
Iron: 15 ug/dL — ABNORMAL LOW (ref 45–182)
Saturation Ratios: 4 % — ABNORMAL LOW (ref 17.9–39.5)
TIBC: 414 ug/dL (ref 250–450)
UIBC: 399 ug/dL — ABNORMAL HIGH (ref 117–376)

## 2024-01-19 LAB — CEA (ACCESS): CEA (CHCC): 6.3 ng/mL — ABNORMAL HIGH (ref 0.00–5.00)

## 2024-01-22 ENCOUNTER — Inpatient Hospital Stay: Payer: Self-pay | Admitting: Licensed Clinical Social Worker

## 2024-01-22 DIAGNOSIS — C189 Malignant neoplasm of colon, unspecified: Secondary | ICD-10-CM

## 2024-01-22 NOTE — Progress Notes (Signed)
 [facility] Clinical Social Work  Initial Assessment   Rodney [name] is a 63 y.o. year old male contacted by phone. Clinical Social Work was referred by medical provider for assessment of psychosocial needs.   SDOH (Social Determinants of Health) assessments performed: Yes   SDOH Screenings   Food Insecurity: No Food Insecurity (01/16/2024)  Housing: Low Risk  (01/16/2024)  Transportation Needs: No Transportation Needs (01/16/2024)  Utilities: Not At Risk (01/16/2024)  Depression (PHQ2-9): Low Risk  (01/16/2024)  Tobacco Use: Medium Risk (01/16/2024)    PHQ 2/9:    01/16/2024    3:54 PM  Depression screen PHQ 2/9  Decreased Interest 0  Down, Depressed, Hopeless 0  PHQ - 2 Score 0     Distress Screen completed: No     No data to display            Family/Social Information:  Housing Arrangement: patient lives with his wife. Family members/support persons in your life? Family and Friends Transportation concerns: no  Employment: Working full time as a Engineer, structural.  Pt does not have benefits through his employer and does not have health insurance.  Pt states when he tried to get [employer] insurance the premiums were too high and he earns too much to qualify for discounted insurance.  Pt also does not qualify for Medicaid.    Income source: Employment Financial concerns: Yes, due to illness and/or loss of work during treatment Type of concern: Medical bills Food access concerns: no Religious or spiritual practice: Not known Advanced directives: Not known Services Currently in place:  none  Coping/ Adjustment to diagnosis: Patient understands treatment plan and what happens next? yes Concerns about diagnosis and/or treatment: Quality of life Patient reported stressors: Therapist, art and/or priorities: pt's priority is to recover from surgery w/ the hope the cancer will not return Patient enjoys not discussed Current coping skills/ strengths: Capable of independent  living , Motivation for treatment/growth , Physical Health , and Supportive family/friends     SUMMARY: Current SDOH Barriers:  Financial constraints related to lack of medical insurance and Substance abuse issues -  pt not interested in assistance at this time  Clinical Social Work Clinical Goal(s):  Explore community resource options for unmet needs related to:  Financial Strain   Interventions: Discussed common feeling and emotions when being diagnosed with cancer, and the importance of support during treatment Informed patient of the support team roles and support services at [facility] Provided CSW contact information and encouraged patient to call with any questions or concerns Pt instructed to contact billing to see if they may be able to adjust his medical bills due to loss of income while recovering from surgery.  Pt also encouraged to set up a payment plan.  Pt states at this time he is unable to sign up for insurance as the premiums would be too high for him to pay.     Follow Up Plan: Patient will contact CSW with any support or resource needs Patient verbalizes understanding of plan: Yes    Devere JONELLE Manna, LCSW Clinical Social Worker [facility]

## 2024-01-25 ENCOUNTER — Encounter (HOSPITAL_COMMUNITY): Payer: Self-pay

## 2024-01-30 ENCOUNTER — Telehealth: Payer: Self-pay | Admitting: Nurse Practitioner

## 2024-01-30 ENCOUNTER — Inpatient Hospital Stay: Payer: Self-pay | Admitting: Nurse Practitioner

## 2024-02-01 ENCOUNTER — Encounter: Payer: Self-pay | Admitting: Hematology

## 2024-02-02 ENCOUNTER — Encounter: Payer: Self-pay | Admitting: Nurse Practitioner

## 2024-02-02 ENCOUNTER — Inpatient Hospital Stay: Payer: Self-pay | Admitting: Nurse Practitioner

## 2024-02-02 DIAGNOSIS — C189 Malignant neoplasm of colon, unspecified: Secondary | ICD-10-CM

## 2024-02-02 NOTE — Progress Notes (Signed)
 [facility]   [phone] Fax:([phone]    Patient Care Team: Rollene Almarie LABOR, MD as PCP - General (Internal Medicine)   I connected with Rodney [name] on 02/02/24 at  8:00 AM EDT by telephone visit and verified that I am speaking with the correct person using two identifiers.   I discussed the limitations, risks, security and privacy concerns of performing an evaluation and management service by telemedicine and the availability of in-person appointments. I also discussed with the patient that there may be a patient responsible charge related to this service. The patient expressed understanding and agreed to proceed.   Other persons participating in the visit and their role in the encounter: Wife   Patient's location: home  Provider's location: [facility] office   CHIEF COMPLAINT: Follow up test results   CURRENT THERAPY: Colon cancer surveillance   INTERVAL HISTORY Rodney [name] presents for phone follow-up as scheduled, last seen in our office 01/16/2024 for initial visit.  He continues to recover from surgery, a small hematoma/seroma at the incision is being managed by surgery.  Pain medications are helping.  Denies overlying redness, warmth, or drainage.  Eating and drinking well but still resting a lot.  Bowels moving normally, denies rectal bleeding.  He continues to take oral iron  which she tolerates well.  He has a normal cough that comes and goes.  Denies chest pain, dyspnea, leg swelling, fever/chills, or any other new or specific complaints.   ROS  All other systems reviewed and negative  Past Medical History:  Diagnosis Date   Anxiety state, unspecified    Calculus of kidney    Lumbago    Personal history of other diseases of digestive system      Past Surgical History:  Procedure Laterality Date   COLONOSCOPY Left 12/24/2023   Procedure: COLONOSCOPY;  Surgeon: Burnette Fallow, MD;  Location: [facility];  Service:  Gastroenterology;  Laterality: Left;   COLOSTOMY REVISION Right 12/27/2023   Procedure: COLECTOMY, RIGHT;  Surgeon: Ebbie Cough, MD;  Location: [facility];  Service: General;  Laterality: Right;   LAPAROTOMY N/A 12/27/2023   Procedure: LAPAROTOMY, EXPLORATORY;  Surgeon: Ebbie Cough, MD;  Location: [facility];  Service: General;  Laterality: N/A;  RIGHT COLECTOMY     Outpatient Encounter Medications as of 02/02/2024  Medication Sig   acetaminophen  (TYLENOL ) 325 MG tablet Take 1 tablet (325 mg total) by mouth every 6 (six) hours as needed for mild pain (pain score 1-3), fever or headache.   HYDROcodone -acetaminophen  (NORCO/VICODIN) 5-325 MG tablet Take 1-2 tablets by mouth every 4 (four) hours as needed for moderate pain (pain score 4-6) or severe pain (pain score 7-10).   methocarbamol  (ROBAXIN ) 500 MG tablet Take 1 tablet (500 mg total) by mouth every 8 (eight) hours as needed for muscle spasms.   No facility-administered encounter medications on file as of 02/02/2024.     There were no vitals filed for this visit. There is no height or weight on file to calculate BMI.   ECOG PERFORMANCE STATUS: 1-2  PHYSICAL EXAM Patient appears well by phone.  Voice is strong, speech is clear.  Mood/affect appear normal for situation.  No cough or conversational dyspnea  LAB DATA     Latest Ref Rng & Units 01/19/2024   10:48 AM 12/31/2023    6:58 AM 12/28/2023    5:27 AM  CBC  WBC 4.0 - 10.5 K/uL 3.8  7.2  12.2   Hemoglobin  13.0 - 17.0 g/dL 89.9  9.7  9.8   Hematocrit 39.0 - 52.0 % 32.5  32.6  32.4   Platelets 150 - 400 K/uL 311  534  506       CMP     Latest Ref Rng & Units 01/19/2024   10:48 AM 12/31/2023    6:58 AM 12/28/2023    5:27 AM  CMP  Glucose 70 - 99 mg/dL 70  86  891   BUN 6 - 20 mg/dL 7  6  11    Creatinine 0.61 - 1.24 mg/dL 9.43  9.27  9.25   Sodium 135 - 145 mmol/L 143  132  132   Potassium 3.5 - 5.1 mmol/L 3.1  4.0  4.4   Chloride 98 - 111 mmol/L 105  98  98   CO2 22 - 32  mmol/L 30  25  27    Calcium  8.9 - 10.3 mg/dL 8.8  8.8  8.5   Total Protein 6.5 - 8.1 g/dL 7.8   6.8   Total Bilirubin 0.0 - 1.2 mg/dL 0.2   0.7   Alkaline Phos 38 - 126 U/L 102   71   AST 15 - 41 U/L 53   30   ALT 0 - 44 U/L 17   15       ASSESSMENT & PLAN: 63 year old male     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   - Colonoscopy by Dr. Burnette showed 4 polyps and a partially obstructing tumor in the ascending colon, path confirmed invasive moderately differentiated adenocarcinoma.  -CT showed a large mass involving majority of the ascending colon 9.8 x 8.6 x 10.3 cm, no evidence of obstruction or metastatic disease.  - Baseline CEA elevated to 105.   -S/p right colectomy by Dr. Ebbie 12/27/23 for right colectomy.  We reviewed the final surgical path which showed a 13 cm adenocarcinoma with mucinous features extending into pericolonic connective tissue.  All margins were negative and all 23 lymph nodes were negative (0/23). - The elevated CEA is a high risk feature, but no other high risk features such as obstruction, perforation, LVI, or PNI. -We recommend MRD testing to stratify his recurrence risk and determine if adjuvant chemo is indicated, he is agreeable. Will do guardant reveal for shorter turn around time (then signatera for future surveillance) -He has no insurance and would like to limit costs if possible. A modified surveillance plan was offered, including lab (signatera, CEA, CBC, CMP) q3 months and f/up q 6 months, then CT and colonoscopy at 1 year, for up to 5 years. He agrees to this.  -Rodney [name] appears well by phone, continues to recover from surgery.  -Post op CEA improved to 6, nearly normal now. We reviewed ctDNA is not detected, which is a good prognostic indicator of lower recurrence risk. Adjuvant  chemotherapy is not indicated.  -Proceed with surveillance    IDA - Presented to ED 12/22/2023 with acute iron  deficiency anemia Hgb 6.0, serum iron  11, 4% saturation, and ferritin of 4; FOBT positive.  -s/p 4 units pRBCs, no IV iron . Discharge Hgb 9.7 -Recommend oral iron  with ferrous sulfate daily with vit C source -Improving, continue oral iron  daily    Social -Declined nutrition referral for weight management, he will try to gain weight back on this own with boost/ensure and supplements -Open to phone  call from SW but does not seem interested in applying for insurance -Family - aware of need for colon cancer screenings -Congratulated on tobacco cessation and strongly encouraged to cut back/eventually quit alcohol. Does not seem ready  -Liver mildly enlarged on staging CT, ?early cirrhosis      PLAN: -Guardant reveal (neg) and recent labs reviewed  -Colon cancer surveillance  -Continue oral iron  po daily with vit C source -Lab (signatera, cbc, cmp, cea, iron  ) in 3 and 6 months -F/up in 6 months    Orders Placed This Encounter  Procedures   Signatera    Select as applicable. If patient is on or planning to receive immunotherapy, select drug: Not on Immunotherapy If Other or Multiple, Write down drug name:  Do not Delete Below This Line   ==========Department Information========== ID: [id number] Department:[facility] [address] [place] [address] Dept: [phone] Dept Fax: [phone]    Standing Status:   Standing    Number of Occurrences:   4    Expiration Date:   02/01/2025    Jennell to follow up with patient for sample collection (mobile phleb, lab, or saliva)::   No    Surveillance Program draw frequency::   Every 3 Months    Surveillance Program draw count::   4    Cancer type::   Colon    Stage of diagnosis::   II    History of recurrence?:   No    Current  disease status::   No evidence of disease    Date of surgery (MM/DD/YYYY)::   12/27/2023    By placing this electronic order I confirm the testing ordered herein is medically necessary and this patient has been informed of the details of the genetic test(s) ordered, including the risks, benefits, and alternatives, and has consented to testing.:   Yes    What type of billing?:   Bill Insurance   CBC with Differential (Cancer Center Only)    Standing Status:   Standing    Number of Occurrences:   4    Expiration Date:   02/01/2025   CEA (Access)-[facility] ONLY    Standing Status:   Standing    Number of Occurrences:   4    Expiration Date:   02/01/2025   CMP (Cancer Center only)    Standing Status:   Standing    Number of Occurrences:   4    Expiration Date:   02/01/2025   Ferritin    Standing Status:   Standing    Number of Occurrences:   4    Expiration Date:   02/01/2025   Iron  and Iron  Binding Capacity ([facility] only)    Standing Status:   Standing    Number of Occurrences:   4    Expiration Date:   02/01/2025     I discussed the assessment and treatment plan with the patient. The patient was provided an opportunity to ask questions and all were answered. The patient agreed with the plan and demonstrated an understanding of the instructions.   The patient was advised to call back or seek an in-person evaluation if the symptoms worsen or if the condition fails to improve as anticipated.  I spent 10 minutes counseling the patient face to face. The total time spent in the appointment was 15 minutes and more than 50% was on counseling, review of test results,  and coordination of care.   Demisha Nokes K Khalaya Mcgurn, NP 02/02/2024

## 2024-02-05 LAB — GUARDANT REVEAL

## 2024-04-16 ENCOUNTER — Other Ambulatory Visit: Payer: Self-pay

## 2024-04-16 ENCOUNTER — Inpatient Hospital Stay: Payer: Self-pay | Attending: Nurse Practitioner

## 2024-04-16 DIAGNOSIS — Z87442 Personal history of urinary calculi: Secondary | ICD-10-CM | POA: Insufficient documentation

## 2024-04-16 DIAGNOSIS — F411 Generalized anxiety disorder: Secondary | ICD-10-CM | POA: Insufficient documentation

## 2024-04-16 DIAGNOSIS — C182 Malignant neoplasm of ascending colon: Secondary | ICD-10-CM | POA: Insufficient documentation

## 2024-04-16 DIAGNOSIS — D509 Iron deficiency anemia, unspecified: Secondary | ICD-10-CM | POA: Insufficient documentation

## 2024-04-16 DIAGNOSIS — R634 Abnormal weight loss: Secondary | ICD-10-CM | POA: Insufficient documentation

## 2024-04-16 DIAGNOSIS — R194 Change in bowel habit: Secondary | ICD-10-CM | POA: Insufficient documentation

## 2024-04-16 DIAGNOSIS — C189 Malignant neoplasm of colon, unspecified: Secondary | ICD-10-CM

## 2024-04-16 DIAGNOSIS — Z79899 Other long term (current) drug therapy: Secondary | ICD-10-CM | POA: Insufficient documentation

## 2024-04-16 DIAGNOSIS — Z800 Family history of malignant neoplasm of digestive organs: Secondary | ICD-10-CM | POA: Insufficient documentation

## 2024-04-16 DIAGNOSIS — K219 Gastro-esophageal reflux disease without esophagitis: Secondary | ICD-10-CM | POA: Insufficient documentation

## 2024-04-16 DIAGNOSIS — Z9049 Acquired absence of other specified parts of digestive tract: Secondary | ICD-10-CM | POA: Insufficient documentation

## 2024-04-16 DIAGNOSIS — Z933 Colostomy status: Secondary | ICD-10-CM | POA: Insufficient documentation

## 2024-04-16 LAB — CMP (CANCER CENTER ONLY)
ALT: 34 U/L (ref 0–44)
AST: 83 U/L — ABNORMAL HIGH (ref 15–41)
Albumin: 4.6 g/dL (ref 3.5–5.0)
Alkaline Phosphatase: 72 U/L (ref 38–126)
Anion gap: 16 — ABNORMAL HIGH (ref 5–15)
BUN: 12 mg/dL (ref 8–23)
CO2: 25 mmol/L (ref 22–32)
Calcium: 10.1 mg/dL (ref 8.9–10.3)
Chloride: 99 mmol/L (ref 98–111)
Creatinine: 0.72 mg/dL (ref 0.61–1.24)
GFR, Estimated: >60 mL/min (ref >60)
Glucose, Bld: 90 mg/dL (ref 70–99)
Potassium: 3.4 mmol/L — ABNORMAL LOW (ref 3.5–5.1)
Sodium: 140 mmol/L (ref 135–145)
Total Bilirubin: 0.7 mg/dL (ref 0.0–1.2)
Total Protein: 8.6 g/dL — ABNORMAL HIGH (ref 6.5–8.1)

## 2024-04-16 LAB — FERRITIN: Ferritin: 38 ng/mL (ref 24–336)

## 2024-04-16 LAB — CBC WITH DIFFERENTIAL (CANCER CENTER ONLY)
Abs Immature Granulocytes: 0 K/uL (ref 0.00–0.07)
Basophils Absolute: 0.1 K/uL (ref 0.0–0.1)
Basophils Relative: 3 %
Eosinophils Absolute: 0 K/uL (ref 0.0–0.5)
Eosinophils Relative: 0 %
HCT: 37 % — ABNORMAL LOW (ref 39.0–52.0)
Hemoglobin: 12.2 g/dL — ABNORMAL LOW (ref 13.0–17.0)
Immature Granulocytes: 0 %
Lymphocytes Relative: 26 %
Lymphs Abs: 0.8 K/uL (ref 0.7–4.0)
MCH: 26.5 pg (ref 26.0–34.0)
MCHC: 33 g/dL (ref 30.0–36.0)
MCV: 80.3 fL (ref 80.0–100.0)
Monocytes Absolute: 0.5 K/uL (ref 0.1–1.0)
Monocytes Relative: 17 %
Neutro Abs: 1.6 K/uL — ABNORMAL LOW (ref 1.7–7.7)
Neutrophils Relative %: 54 %
Platelet Count: 279 K/uL (ref 150–400)
RBC: 4.61 MIL/uL (ref 4.22–5.81)
RDW: 17.2 % — ABNORMAL HIGH (ref 11.5–15.5)
WBC Count: 3 K/uL — ABNORMAL LOW (ref 4.0–10.5)
nRBC: 0 % (ref 0.0–0.2)

## 2024-04-16 LAB — CEA (ACCESS): CEA (CHCC): 1.85 ng/mL (ref 0.00–5.00)

## 2024-04-16 LAB — IRON AND IRON BINDING CAPACITY (CC-WL,HP ONLY)
Iron: 232 ug/dL — ABNORMAL HIGH (ref 45–182)
Saturation Ratios: 41 % — ABNORMAL HIGH (ref 17.9–39.5)
TIBC: 566 ug/dL — ABNORMAL HIGH (ref 250–450)
UIBC: 334 ug/dL

## 2024-04-16 LAB — GENETIC SCREENING ORDER

## 2024-04-19 ENCOUNTER — Telehealth: Payer: Self-pay

## 2024-04-19 ENCOUNTER — Other Ambulatory Visit: Payer: Self-pay

## 2024-04-19 NOTE — Telephone Encounter (Signed)
 Received telephone call from the patient requesting to speak with a provider to go over 04/16/24 lab results. Scheduled telephone visit w/ Lacie Burton, for Monday 04/22/24 @10 :00am. Patient voiced understanding.  Confirmed [phone] telephone number w/ patient.

## 2024-04-22 ENCOUNTER — Encounter: Payer: Self-pay | Admitting: Nurse Practitioner

## 2024-04-22 ENCOUNTER — Inpatient Hospital Stay: Payer: Self-pay | Admitting: Nurse Practitioner

## 2024-04-22 DIAGNOSIS — C189 Malignant neoplasm of colon, unspecified: Secondary | ICD-10-CM

## 2024-04-22 NOTE — Progress Notes (Signed)
 [facility]   Telephone:([phone] Fax:(336) [phone]    Patient Care Team: Rollene Almarie LABOR, MD as PCP - General (Internal Medicine)   I connected with Rodney [name] on 04/22/24 at 10:00 AM EST by telephone visit and verified that I am speaking with the correct person using two identifiers.   I discussed the limitations, risks, security and privacy concerns of performing an evaluation and management service by telemedicine and the availability of in-person appointments. I also discussed with the patient that there may be a patient responsible charge related to this service. The patient expressed understanding and agreed to proceed.   Other persons participating in the visit and their role in the encounter: None   Patient's location: Home  Provider's location: [facility] Office   Chief Complaint: lab review  Oncology History  Colon adenocarcinoma (HCC)  12/26/2023 Initial Diagnosis   Colon adenocarcinoma (HCC)   12/27/2023 Cancer Staging   Staging form: Colon and Rectum, AJCC 8th Edition - Clinical stage from 12/27/2023: Stage IIA (cT3, cN0, cM0) - Signed by Zohaib Heeney K, NP on 01/16/2024 Stage prefix: Initial diagnosis Total positive nodes: 0 Total nodes examined: 23 Histologic grade (G): G2 Histologic grading system: 4 grade system Laterality: Right Lymph-vascular invasion (LVI): LVI not present (absent)/not identified Carcinoembryonic antigen (CEA) (ng/mL): 105      CURRENT THERAPY: Colon cancer surveillance   INTERVAL HISTORY Rodney [name] presents by phone to review recent labs. Feeling well, no changes. Denies issues from surgery. Energy and appetite are good. Denies change in bowel habits, abdominal pain/bloating, black/bloody stools. He had h/v x1 due to something he ate. Not currently taking oral iron .   ROS  All other systems reviewed and negative   Past Medical History:  Diagnosis Date   Anxiety state, unspecified    Calculus of kidney     Lumbago    Personal history of other diseases of digestive system      Past Surgical History:  Procedure Laterality Date   COLONOSCOPY Left 12/24/2023   Procedure: COLONOSCOPY;  Surgeon: Burnette Fallow, MD;  Location: [facility];  Service: Gastroenterology;  Laterality: Left;   COLOSTOMY REVISION Right 12/27/2023   Procedure: COLECTOMY, RIGHT;  Surgeon: Ebbie Cough, MD;  Location: [facility];  Service: General;  Laterality: Right;   LAPAROTOMY N/A 12/27/2023   Procedure: LAPAROTOMY, EXPLORATORY;  Surgeon: Ebbie Cough, MD;  Location: [facility];  Service: General;  Laterality: N/A;  RIGHT COLECTOMY     Outpatient Encounter Medications as of 04/22/2024  Medication Sig   acetaminophen  (TYLENOL ) 325 MG tablet Take 1 tablet (325 mg total) by mouth every 6 (six) hours as needed for mild pain (pain score 1-3), fever or headache.   HYDROcodone -acetaminophen  (NORCO/VICODIN) 5-325 MG tablet Take 1-2 tablets by mouth every 4 (four) hours as needed for moderate pain (pain score 4-6) or severe pain (pain score 7-10).   methocarbamol  (ROBAXIN ) 500 MG tablet Take 1 tablet (500 mg total) by mouth every 8 (eight) hours as needed for muscle spasms.   No facility-administered encounter medications on file as of 04/22/2024.     There were no vitals filed for this visit. There is no height or weight on file to calculate BMI.   ECOG PERFORMANCE STATUS: 0 - Asymptomatic  PHYSICAL EXAM Patient appears well by phone.  Voice is strong, speech is clear.  Mood/affect appear normal for situation.  No cough or conversational dyspnea  CBC    Latest Ref Rng & Units  04/16/2024   11:01 AM 01/19/2024   10:48 AM 12/31/2023    6:58 AM  CBC  WBC 4.0 - 10.5 K/uL 3.0  3.8  7.2   Hemoglobin 13.0 - 17.0 g/dL 87.7  89.9  9.7   Hematocrit 39.0 - 52.0 % 37.0  32.5  32.6   Platelets 150 - 400 K/uL 279  311  534       CMP     Latest Ref Rng & Units 04/16/2024   11:01 AM 01/19/2024   10:48 AM 12/31/2023    6:58 AM   CMP  Glucose 70 - 99 mg/dL 90  70  86   BUN 8 - 23 mg/dL 12  7  6    Creatinine 0.61 - 1.24 mg/dL 9.27  9.43  9.27   Sodium 135 - 145 mmol/L 140  143  132   Potassium 3.5 - 5.1 mmol/L 3.4  3.1  4.0   Chloride 98 - 111 mmol/L 99  105  98   CO2 22 - 32 mmol/L 25  30  25    Calcium  8.9 - 10.3 mg/dL 89.8  8.8  8.8   Total Protein 6.5 - 8.1 g/dL 8.6  7.8    Total Bilirubin 0.0 - 1.2 mg/dL 0.7  0.2    Alkaline Phos 38 - 126 U/L 72  102    AST 15 - 41 U/L 83  53    ALT 0 - 44 U/L 34  17        ASSESSMENT & PLAN:63 year old male     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   - Colonoscopy by Dr. Burnette showed 4 polyps and a partially obstructing tumor in the ascending colon, path confirmed invasive moderately differentiated adenocarcinoma.  -CT showed a large mass involving majority of the ascending colon 9.8 x 8.6 x 10.3 cm, no evidence of obstruction or metastatic disease.  - Baseline CEA elevated to 105.   -S/p right colectomy by Dr. Ebbie 12/27/23 for right colectomy.  We reviewed the final surgical path which showed a 13 cm adenocarcinoma with mucinous features extending into pericolonic connective tissue.  All margins were negative and all 23 lymph nodes were negative (0/23). - The elevated CEA is a high risk feature, but no other high risk features such as obstruction, perforation, LVI, or PNI.  CEA decreased to normal few months after surgery - Postop guardant reveal 01/19/2024 was negative/not detected, adjuvant chemotherapy was not recommended and he began surveillance   IDA - Presented to ED 12/22/2023 with acute iron  deficiency anemia Hgb 6.0, serum iron  11, 4% saturation, and ferritin of 4; FOBT positive.  -s/p 4 units pRBCs, no IV iron . Discharge Hgb 9.7 -Recommend oral iron  with ferrous sulfate daily with vit C  source - 04/22/2024: Currently not taking oral iron  due to GERD/dyspepsia     Social -Declined nutrition referral for weight management, he will try to gain weight back on this own with boost/ensure and supplements -Open to phone call from SW but does not seem interested in applying for insurance -Family - aware of need for colon cancer screenings -Congratulated on tobacco cessation and strongly encouraged to cut back/eventually quit alcohol. Does not seem ready  -Liver mildly enlarged on staging CT, ?early cirrhosis    Disposition: Rodney [name] appears to be doing very well by phone. I reviewed recent labs which show  normalized CEA, improved IDA, and mild leukopenia. Denies recent infection. Encouraged K rich diet and healthy lifestyle. Overall no clinical concern for recurrence.   We will follow up the pending signatera, I recommend surveillance CT before next visit in 3 months. He knows to call with concerning s/sx.   We are available to see him before next scheduled visit 07/15/24 if needed.   Orders Placed This Encounter  Procedures   CT ABDOMEN PELVIS W CONTRAST    Standing Status:   Future    Expected Date:   07/08/2024    Expiration Date:   04/22/2025    If indicated for the ordered procedure, I authorize the administration of contrast media per Radiology protocol:   Yes    Does the patient have a contrast media/X-ray dye allergy?:   No    Preferred imaging location?:   [facility]    If indicated for the ordered procedure, I authorize the administration of oral contrast media per Radiology protocol:   Yes      All questions were answered. The patient knows to call the clinic with any problems, questions or concerns. No barriers to learning were detected. I spent 7 minutes counseling the patient non face to face. The total time spent in the appointment was 10 minutes and more than 50% was on counseling, review of test results, and coordination of care.   Jesselle Laflamme K Cosmo Tetreault,  NP 04/22/2024

## 2024-05-21 LAB — SIGNATERA
SIGNATERA MTM READOUT: 0 MTM/ml
SIGNATERA TEST RESULT: NEGATIVE

## 2024-05-22 ENCOUNTER — Encounter (HOSPITAL_COMMUNITY): Payer: Self-pay

## 2024-05-23 ENCOUNTER — Ambulatory Visit: Payer: Self-pay | Admitting: Nurse Practitioner

## 2024-05-28 ENCOUNTER — Other Ambulatory Visit: Payer: Self-pay

## 2024-06-06 ENCOUNTER — Other Ambulatory Visit: Payer: Self-pay

## 2024-07-09 ENCOUNTER — Ambulatory Visit (HOSPITAL_COMMUNITY): Payer: Self-pay

## 2024-07-15 ENCOUNTER — Inpatient Hospital Stay: Payer: Self-pay | Attending: Nurse Practitioner

## 2024-07-15 ENCOUNTER — Inpatient Hospital Stay: Payer: Self-pay | Admitting: Nurse Practitioner
